# Patient Record
Sex: Female | Born: 1945 | Race: Black or African American | Hispanic: No | Smoking: Former smoker
Health system: Southern US, Community
[De-identification: ages and names within clinical notes are randomized; demographics above are authoritative.]

## PROBLEM LIST (undated history)

## (undated) DIAGNOSIS — I7090 Unspecified atherosclerosis: Secondary | ICD-10-CM

## (undated) DIAGNOSIS — M8580 Other specified disorders of bone density and structure, unspecified site: Secondary | ICD-10-CM

## (undated) DIAGNOSIS — E7800 Pure hypercholesterolemia, unspecified: Secondary | ICD-10-CM

## (undated) DIAGNOSIS — Z86010 Personal history of colonic polyps: Secondary | ICD-10-CM

## (undated) DIAGNOSIS — I10 Essential (primary) hypertension: Secondary | ICD-10-CM

## (undated) DIAGNOSIS — R7303 Prediabetes: Secondary | ICD-10-CM

## (undated) DIAGNOSIS — J45909 Unspecified asthma, uncomplicated: Secondary | ICD-10-CM

## (undated) DIAGNOSIS — M1990 Unspecified osteoarthritis, unspecified site: Secondary | ICD-10-CM

## (undated) HISTORY — DX: Unspecified atherosclerosis: I70.90

## (undated) HISTORY — DX: Unspecified asthma, uncomplicated: J45.909

## (undated) HISTORY — DX: Other specified disorders of bone density and structure, unspecified site: M85.80

## (undated) HISTORY — DX: Personal history of colonic polyps: Z86.010

## (undated) HISTORY — DX: Unspecified osteoarthritis, unspecified site: M19.90

## (undated) HISTORY — DX: Prediabetes: R73.03

---

## 1988-08-03 HISTORY — PX: PARTIAL HYSTERECTOMY: SHX80

## 1998-02-20 ENCOUNTER — Emergency Department (HOSPITAL_COMMUNITY): Admission: EM | Admit: 1998-02-20 | Discharge: 1998-02-20 | Payer: Self-pay | Admitting: Internal Medicine

## 1998-12-09 ENCOUNTER — Other Ambulatory Visit: Admission: RE | Admit: 1998-12-09 | Discharge: 1998-12-09 | Payer: Self-pay | Admitting: Obstetrics and Gynecology

## 2000-02-16 ENCOUNTER — Ambulatory Visit (HOSPITAL_COMMUNITY): Admission: RE | Admit: 2000-02-16 | Discharge: 2000-02-16 | Payer: Self-pay | Admitting: Family Medicine

## 2000-02-16 ENCOUNTER — Encounter: Payer: Self-pay | Admitting: Family Medicine

## 2001-09-01 ENCOUNTER — Encounter: Payer: Self-pay | Admitting: Family Medicine

## 2001-09-01 ENCOUNTER — Ambulatory Visit (HOSPITAL_COMMUNITY): Admission: RE | Admit: 2001-09-01 | Discharge: 2001-09-01 | Payer: Self-pay | Admitting: Obstetrics and Gynecology

## 2002-02-07 ENCOUNTER — Ambulatory Visit (HOSPITAL_BASED_OUTPATIENT_CLINIC_OR_DEPARTMENT_OTHER): Admission: RE | Admit: 2002-02-07 | Discharge: 2002-02-07 | Payer: Self-pay | Admitting: Orthopedic Surgery

## 2002-08-03 HISTORY — PX: BREAST EXCISIONAL BIOPSY: SUR124

## 2003-02-16 ENCOUNTER — Encounter: Admission: RE | Admit: 2003-02-16 | Discharge: 2003-02-16 | Payer: Self-pay | Admitting: Family Medicine

## 2003-02-16 ENCOUNTER — Encounter: Payer: Self-pay | Admitting: Family Medicine

## 2003-04-10 ENCOUNTER — Encounter: Admission: RE | Admit: 2003-04-10 | Discharge: 2003-04-10 | Payer: Self-pay | Admitting: General Surgery

## 2003-04-10 ENCOUNTER — Encounter (INDEPENDENT_AMBULATORY_CARE_PROVIDER_SITE_OTHER): Payer: Self-pay | Admitting: Specialist

## 2003-04-10 ENCOUNTER — Encounter: Payer: Self-pay | Admitting: General Surgery

## 2003-04-10 ENCOUNTER — Ambulatory Visit (HOSPITAL_BASED_OUTPATIENT_CLINIC_OR_DEPARTMENT_OTHER): Admission: RE | Admit: 2003-04-10 | Discharge: 2003-04-10 | Payer: Self-pay | Admitting: General Surgery

## 2004-06-23 ENCOUNTER — Ambulatory Visit (HOSPITAL_COMMUNITY): Admission: RE | Admit: 2004-06-23 | Discharge: 2004-06-23 | Payer: Self-pay | Admitting: Family Medicine

## 2007-12-17 ENCOUNTER — Emergency Department (HOSPITAL_COMMUNITY): Admission: EM | Admit: 2007-12-17 | Discharge: 2007-12-17 | Payer: Self-pay | Admitting: Emergency Medicine

## 2008-01-03 ENCOUNTER — Encounter (INDEPENDENT_AMBULATORY_CARE_PROVIDER_SITE_OTHER): Payer: Self-pay | Admitting: Family Medicine

## 2008-01-03 ENCOUNTER — Ambulatory Visit: Payer: Self-pay | Admitting: Internal Medicine

## 2008-01-03 LAB — CONVERTED CEMR LAB
Albumin: 4.5 g/dL (ref 3.5–5.2)
BUN: 14 mg/dL (ref 6–23)
CO2: 26 meq/L (ref 19–32)
Calcium: 9.5 mg/dL (ref 8.4–10.5)
Cholesterol: 177 mg/dL (ref 0–200)
Eosinophils Relative: 5 % (ref 0–5)
Glucose, Bld: 96 mg/dL (ref 70–99)
HCT: 41.7 % (ref 36.0–46.0)
Lymphocytes Relative: 55 % — ABNORMAL HIGH (ref 12–46)
Lymphs Abs: 2.3 10*3/uL (ref 0.7–4.0)
Monocytes Relative: 8 % (ref 3–12)
Neutrophils Relative %: 31 % — ABNORMAL LOW (ref 43–77)
Platelets: 281 10*3/uL (ref 150–400)
Potassium: 4.3 meq/L (ref 3.5–5.3)
RBC: 4.35 M/uL (ref 3.87–5.11)
Sodium: 142 meq/L (ref 135–145)
Total Protein: 7.2 g/dL (ref 6.0–8.3)
Triglycerides: 188 mg/dL — ABNORMAL HIGH (ref <150)
WBC: 4.1 10*3/uL (ref 4.0–10.5)

## 2008-01-23 ENCOUNTER — Ambulatory Visit: Payer: Self-pay | Admitting: Internal Medicine

## 2008-01-25 ENCOUNTER — Ambulatory Visit: Payer: Self-pay | Admitting: *Deleted

## 2008-02-22 ENCOUNTER — Ambulatory Visit: Payer: Self-pay | Admitting: Family Medicine

## 2008-03-07 ENCOUNTER — Ambulatory Visit: Payer: Self-pay | Admitting: Internal Medicine

## 2008-03-07 LAB — CONVERTED CEMR LAB
BUN: 14 mg/dL (ref 6–23)
CO2: 27 meq/L (ref 19–32)
Chloride: 102 meq/L (ref 96–112)
Glucose, Bld: 103 mg/dL — ABNORMAL HIGH (ref 70–99)
Potassium: 3.8 meq/L (ref 3.5–5.3)

## 2008-03-21 ENCOUNTER — Ambulatory Visit: Payer: Self-pay | Admitting: Internal Medicine

## 2008-04-27 ENCOUNTER — Ambulatory Visit: Payer: Self-pay | Admitting: Family Medicine

## 2008-06-25 ENCOUNTER — Ambulatory Visit: Payer: Self-pay | Admitting: Family Medicine

## 2008-07-09 ENCOUNTER — Ambulatory Visit: Payer: Self-pay | Admitting: Internal Medicine

## 2008-07-23 ENCOUNTER — Ambulatory Visit: Payer: Self-pay | Admitting: Family Medicine

## 2008-08-09 ENCOUNTER — Ambulatory Visit: Payer: Self-pay | Admitting: Family Medicine

## 2008-08-10 ENCOUNTER — Encounter: Payer: Self-pay | Admitting: Family Medicine

## 2008-08-10 LAB — CONVERTED CEMR LAB
Albumin: 4.5 g/dL (ref 3.5–5.2)
Alkaline Phosphatase: 63 units/L (ref 39–117)
BUN: 11 mg/dL (ref 6–23)
Barbiturate Quant, Ur: NEGATIVE
Cocaine Metabolites: NEGATIVE
Creatinine, Ser: 0.95 mg/dL (ref 0.40–1.20)
Creatinine,U: 153.8 mg/dL
Glucose, Bld: 99 mg/dL (ref 70–99)
HDL: 71 mg/dL (ref >39)
LDL Cholesterol: 136 mg/dL — ABNORMAL HIGH (ref 0–99)
Opiate Screen, Urine: NEGATIVE
Potassium: 4.1 meq/L (ref 3.5–5.3)
Total Bilirubin: 0.5 mg/dL (ref 0.3–1.2)
Total CHOL/HDL Ratio: 3.3
Triglycerides: 151 mg/dL — ABNORMAL HIGH (ref <150)

## 2008-08-22 ENCOUNTER — Ambulatory Visit: Payer: Self-pay | Admitting: Internal Medicine

## 2008-08-29 ENCOUNTER — Ambulatory Visit (HOSPITAL_COMMUNITY): Admission: RE | Admit: 2008-08-29 | Discharge: 2008-08-29 | Payer: Self-pay | Admitting: Family Medicine

## 2008-11-07 ENCOUNTER — Encounter: Payer: Self-pay | Admitting: Family Medicine

## 2008-11-07 ENCOUNTER — Ambulatory Visit: Payer: Self-pay | Admitting: Internal Medicine

## 2008-11-07 LAB — CONVERTED CEMR LAB
Total CHOL/HDL Ratio: 3
VLDL: 26 mg/dL (ref 0–40)

## 2008-11-14 ENCOUNTER — Ambulatory Visit: Payer: Self-pay | Admitting: Internal Medicine

## 2009-01-16 ENCOUNTER — Ambulatory Visit: Payer: Self-pay | Admitting: Internal Medicine

## 2009-03-19 ENCOUNTER — Ambulatory Visit: Payer: Self-pay | Admitting: Internal Medicine

## 2010-12-19 NOTE — Op Note (Signed)
. [facility]  Patient:    Kelly Mccann, Kelly Mccann Visit Number: [id number] MRN: [id number]          Service Type: DSU Location: [facility] Attending Physician:  Ronne Binning Dictated by:   Nicki Reaper, M.D. Proc. Date: 02/07/02 Admit Date:  02/07/2002                             Operative Report  PREOPERATIVE DIAGNOSIS:  Stenosing synovitis, right thumb.  POSTOPERATIVE DIAGNOSIS:  Stenosing synovitis, right thumb.  OPERATION:  Release of A-1 pulley, right thumb.  SURGEON:  Nicki Reaper, M.D.  ASSISTANT:  Joaquin Courts, R.N.  ANESTHESIA:  Forearm-based IV regional.  HISTORY:  The patient is a 65 year old female with history of triggering of her right thumb not responsive to conservative treatment.  DESCRIPTION OF PROCEDURE:  The patient was brought to the operating room where a forearm-based IV regional anesthetic was carried out without difficulty. She was prepped and draped using Betadine scrubbing solution with the right arm free.  A transverse incision was made over the A-1 pulley and carried down through subcutaneous tissue.  Bleeders were electrocauterized, the neurovascular structures protected.  The A-1 pulley was identified.  To the radial side, an incision was made, releasing the A-1 pulley; the oblique pulley was left intact.  The thumb was placed through a full range of motion and no further triggering was identified.  The wound was irrigated.  The skin was closed with interrupted 5-0 nylon sutures.  A sterile compressive dressing was applied.  The patient tolerated the procedure well and was taken to the recovery room for observation in satisfactory condition.  She is discharged home to return to the [facility] in one week on Vicodin and Keflex. Dictated by:   Nicki Reaper, M.D. Attending Physician:  Ronne Binning DD:  02/07/02 TD:  02/09/02 Job: [id number]

## 2010-12-19 NOTE — Op Note (Signed)
   NAME:  Selvey, Dinita A                         ACCOUNT NO.:  [id number]   MEDICAL RECORD NO.:  [id number]                   PATIENT TYPE:  AMB   LOCATION:  DSC                                  FACILITY:  [facility]   PHYSICIAN:  Rose Phi. Maple Hudson, M.D.                DATE OF BIRTH:  1946-05-15   DATE OF PROCEDURE:  04/10/2003  DATE OF DISCHARGE:                                 OPERATIVE REPORT   PREOPERATIVE DIAGNOSIS:  Left breast mass.   POSTOPERATIVE DIAGNOSIS:  Left breast mass.   OPERATION:  Excision of left breast mass with needle localization and  specimen mammography.   SURGEON:  Rose Phi. Maple Hudson, M.D.   ANESTHESIA:  MAC.   OPERATIVE PROCEDURE:  The patient was placed on the operating table and the  left breast prepped and draped in usual fashion.  The mass was at about the  12 o'clock position and, using the wire as a guide, a curved incision was  outlined.  The area was then thoroughly infiltrated with a mixture of 1%  Xylocaine and 0.25% Marcaine.  The incision was then made and a wide  excision of the wire and surrounding tissue was carried out.  Specimen  mammography confirmed the removal of the nodule.  Hemostasis was obtained  with the cautery.  Subcuticular closure of 4-0 Monocryl and Steri-Strips was  carried out.  Dressing applied.  Patient transferred to recovery room in  satisfactory condition, having tolerated the procedure well.                                               Rose Phi. Maple Hudson, M.D.    PRY/MEDQ  D:  04/10/2003  T:  04/10/2003  Job:  [id number]

## 2011-04-29 LAB — BASIC METABOLIC PANEL
CO2: 28
Calcium: 9.4
GFR calc Af Amer: >60
GFR calc non Af Amer: >60
Sodium: 141

## 2013-01-17 ENCOUNTER — Other Ambulatory Visit (HOSPITAL_COMMUNITY): Payer: Self-pay | Admitting: Internal Medicine

## 2013-01-17 DIAGNOSIS — Z1231 Encounter for screening mammogram for malignant neoplasm of breast: Secondary | ICD-10-CM

## 2013-01-30 ENCOUNTER — Ambulatory Visit (HOSPITAL_COMMUNITY)
Admission: RE | Admit: 2013-01-30 | Discharge: 2013-01-30 | Disposition: A | Discharge status: Home / Self Care | Payer: Medicare Other | Source: Ambulatory Visit | Attending: Internal Medicine | Admitting: Internal Medicine

## 2013-01-30 DIAGNOSIS — Z1231 Encounter for screening mammogram for malignant neoplasm of breast: Secondary | ICD-10-CM | POA: Insufficient documentation

## 2014-02-19 ENCOUNTER — Other Ambulatory Visit (HOSPITAL_COMMUNITY): Payer: Self-pay | Admitting: Internal Medicine

## 2014-02-19 DIAGNOSIS — Z1231 Encounter for screening mammogram for malignant neoplasm of breast: Secondary | ICD-10-CM

## 2014-03-06 ENCOUNTER — Ambulatory Visit (HOSPITAL_COMMUNITY)
Admission: RE | Admit: 2014-03-06 | Discharge: 2014-03-06 | Disposition: A | Discharge status: Home / Self Care | Payer: Medicare Other | Source: Ambulatory Visit | Attending: Internal Medicine | Admitting: Internal Medicine

## 2014-03-06 DIAGNOSIS — Z1231 Encounter for screening mammogram for malignant neoplasm of breast: Secondary | ICD-10-CM | POA: Diagnosis present

## 2015-02-22 ENCOUNTER — Other Ambulatory Visit (HOSPITAL_COMMUNITY): Payer: Self-pay | Admitting: Internal Medicine

## 2015-02-22 DIAGNOSIS — Z1231 Encounter for screening mammogram for malignant neoplasm of breast: Secondary | ICD-10-CM

## 2015-03-08 ENCOUNTER — Ambulatory Visit (HOSPITAL_COMMUNITY)
Admission: RE | Admit: 2015-03-08 | Discharge: 2015-03-08 | Disposition: A | Discharge status: Home / Self Care | Payer: Medicare Other | Source: Ambulatory Visit | Attending: Internal Medicine | Admitting: Internal Medicine

## 2015-03-08 DIAGNOSIS — Z1231 Encounter for screening mammogram for malignant neoplasm of breast: Secondary | ICD-10-CM

## 2015-08-18 ENCOUNTER — Emergency Department (HOSPITAL_COMMUNITY): Payer: Medicare Other

## 2015-08-18 ENCOUNTER — Encounter (HOSPITAL_COMMUNITY): Payer: Self-pay | Admitting: Emergency Medicine

## 2015-08-18 ENCOUNTER — Emergency Department (HOSPITAL_COMMUNITY)
Admission: EM | Admit: 2015-08-18 | Discharge: 2015-08-18 | Disposition: A | Discharge status: Home / Self Care | Payer: Medicare Other | Attending: Emergency Medicine | Admitting: Emergency Medicine

## 2015-08-18 DIAGNOSIS — Z8639 Personal history of other endocrine, nutritional and metabolic disease: Secondary | ICD-10-CM | POA: Insufficient documentation

## 2015-08-18 DIAGNOSIS — I10 Essential (primary) hypertension: Secondary | ICD-10-CM | POA: Insufficient documentation

## 2015-08-18 DIAGNOSIS — R000 Tachycardia, unspecified: Secondary | ICD-10-CM | POA: Insufficient documentation

## 2015-08-18 DIAGNOSIS — Z87891 Personal history of nicotine dependence: Secondary | ICD-10-CM | POA: Insufficient documentation

## 2015-08-18 DIAGNOSIS — J209 Acute bronchitis, unspecified: Secondary | ICD-10-CM | POA: Insufficient documentation

## 2015-08-18 DIAGNOSIS — R0602 Shortness of breath: Secondary | ICD-10-CM | POA: Diagnosis present

## 2015-08-18 DIAGNOSIS — J40 Bronchitis, not specified as acute or chronic: Secondary | ICD-10-CM

## 2015-08-18 HISTORY — DX: Pure hypercholesterolemia, unspecified: E78.00

## 2015-08-18 HISTORY — DX: Essential (primary) hypertension: I10

## 2015-08-18 MED ORDER — IPRATROPIUM BROMIDE 0.02 % IN SOLN
0.5000 mg | Freq: Once | RESPIRATORY_TRACT | Status: AC
  Administered 2015-08-18: 0.5 mg via RESPIRATORY_TRACT
  Filled 2015-08-18: qty 2.5

## 2015-08-18 MED ORDER — AZITHROMYCIN 250 MG PO TABS: 250.0000 mg | ORAL_TABLET | Freq: Every day | ORAL | Status: DC

## 2015-08-18 MED ORDER — ALBUTEROL SULFATE (2.5 MG/3ML) 0.083% IN NEBU
5.0000 mg | INHALATION_SOLUTION | Freq: Once | RESPIRATORY_TRACT | Status: AC
  Administered 2015-08-18: 5 mg via RESPIRATORY_TRACT
  Filled 2015-08-18: qty 6

## 2015-08-18 MED ORDER — PREDNISONE 20 MG PO TABS: 40.0000 mg | ORAL_TABLET | Freq: Every day | ORAL | Status: DC

## 2015-08-18 MED ORDER — PREDNISONE 20 MG PO TABS
60.0000 mg | ORAL_TABLET | Freq: Once | ORAL | Status: AC
  Administered 2015-08-18: 60 mg via ORAL
  Filled 2015-08-18: qty 3

## 2015-08-18 NOTE — ED Notes (Signed)
Patient transported to X-ray 

## 2015-08-18 NOTE — ED Notes (Signed)
Family at bedside. 

## 2015-08-18 NOTE — ED Provider Notes (Addendum)
CSN: [id number]     Arrival date & time 08/18/15  0945 History   First MD Initiated Contact with Patient 08/18/15 1021     Chief Complaint  Patient presents with  . Shortness of Breath     (Consider location/radiation/quality/duration/timing/severity/associated sxs/prior Treatment) Patient is a 70 y.o. female presenting with shortness of breath. The history is provided by the patient.  Shortness of Breath Severity:  Moderate Onset quality:  Gradual Duration:  2 weeks Timing:  Constant Progression:  Worsening Chronicity:  New Context: URI   Relieved by:  Nothing Worsened by:  Exertion Ineffective treatments:  Inhaler Associated symptoms: cough, fever, sputum production and wheezing   Associated symptoms: no abdominal pain, no syncope and no vomiting   Associated symptoms comment:  States her chest is sore from all the coughing Risk factors: no prolonged immobilization and no tobacco use   Risk factors comment:  Patient states she hasn't smoked for tender 15 years denies drug or alcohol use. States in the past she has had use an inhaler for wheezing   Past Medical History  Diagnosis Date  . Hypertension   . Hypercholesterolemia    No past surgical history on file. No family history on file. Social History  Substance Use Topics  . Smoking status: Former Smoker    Types: Cigarettes    Quit date: 08/18/1999  . Smokeless tobacco: Not on file  . Alcohol Use: No   OB History    No data available     Review of Systems  Constitutional: Positive for fever.  Respiratory: Positive for cough, sputum production, shortness of breath and wheezing.   Cardiovascular: Negative for syncope.  Gastrointestinal: Negative for vomiting and abdominal pain.  All other systems reviewed and are negative.     Allergies  Review of patient's allergies indicates no known allergies.  Home Medications   Prior to Admission medications   Not on File   BP 147/97 mmHg  Pulse 113   Temp(Src) 98 F (36.7 C) (Oral)  Resp 19  SpO2 96% Physical Exam  Constitutional: She is oriented to person, place, and time. She appears well-developed and well-nourished. No distress.  HENT:  Head: Normocephalic and atraumatic.  Right Ear: Tympanic membrane normal.  Left Ear: Tympanic membrane normal.  Nose: Mucosal edema and rhinorrhea present.  Mouth/Throat: Oropharynx is clear and moist.  Eyes: Conjunctivae and EOM are normal. Pupils are equal, round, and reactive to light.  Neck: Normal range of motion. Neck supple.  Cardiovascular: Regular rhythm and intact distal pulses.  Tachycardia present.   No murmur heard. Pulmonary/Chest: Effort normal. No respiratory distress. She has wheezes. She has no rales.  Abdominal: Soft. She exhibits no distension. There is no tenderness. There is no rebound and no guarding.  Musculoskeletal: Normal range of motion. She exhibits no edema or tenderness.  Neurological: She is alert and oriented to person, place, and time.  Skin: Skin is warm and dry. No rash noted. No erythema.  Psychiatric: She has a normal mood and affect. Her behavior is normal.  Nursing note and vitals reviewed.   ED Course  Procedures (including critical care time) Labs Review Labs Reviewed - No data to display  Imaging Review Dg Chest 2 View  08/18/2015  CLINICAL DATA:  Wheezing and shortness of breath 2 weeks worsening over the past 3 days. Cough. EXAM: CHEST  2 VIEW COMPARISON:  06/23/2004 FINDINGS: Lungs are adequately inflated without consolidation or effusion. Cardiomediastinal silhouette is within normal. Mild emphysematous disease over  the upper lungs. Cardiomediastinal silhouette is within normal. There is degenerative change of the spine. IMPRESSION: No active cardiopulmonary disease. Emphysematous disease. Electronically Signed   By: Marin Olp M.D.   On: 08/18/2015 11:38   I have personally reviewed and evaluated these images and lab results as part of my  medical decision-making.   EKG Interpretation   Date/Time:  Sunday August 18 2015 09:53:07 EST Ventricular Rate:  121 PR Interval:  107 QRS Duration: 97 QT Interval:  334 QTC Calculation: 474 R Axis:   49 Text Interpretation:  Sinus tachycardia Borderline low voltage, extremity  leads Nonspecific repol abnormality, diffuse leads No significant change  since last tracing Confirmed by Maryan Rued  MD, Loree Fee ([id number]) on 08/18/2015  10:22:46 AM      MDM   Final diagnoses:  Bronchitis    Patient is a 70 year old female with a history of hypertension, hyperlipidemia and was a former smoker approximately 10 or 15 years ago who stopped taking all of her medications including hydrochlorothiazide and atenolol 2 weeks ago but comes in today with cold-like symptoms, wheezing and shortness of breath. Patient states that she's had to use an albuterol inhaler in the past but does not have a diagnosis of COPD or asthma. She is wheezing diffusely on exam but able to speak in complete sentences and oxygen saturation is 96%. Patient is tachycardic between 110 and 120 however she was beta blocked and is currently not taking a beta blocker.  EKG shows sinus tachycardia without evidence of A. fib or other dysrhythmia.  Patient is otherwise well-appearing. Feel most likely patient's symptoms are related to URI with bronchitis versus pneumonia. Patient has no signs of fluid overload concerning for CHF. She was given albuterol, Atrovent and prednisone. Chest x-ray pending  12:08 PM Chest x-ray without acute findings. Patient's wheezing improved with first neb will give a second  2:56 PM Pt much improved after her 3rd treatment however remained tachycardic which is most likely related to multiple albuterol treatments and recent self d/c of atenolol and pt was d/ced home with azithro and prednisone.  Blanchie Dessert, MD 08/18/15 1457  Blanchie Dessert, MD 08/19/15 [phone]

## 2015-08-18 NOTE — ED Notes (Signed)
Pt states that she has been feeling increasingly SOB x 2 wks.  States that she took herself off of her blood pressure medicine last Monday and replaced it with flax seed and garlic.  Denies CP.  Expiratory wheezing bilaterally.

## 2016-03-14 ENCOUNTER — Encounter (HOSPITAL_COMMUNITY): Payer: Self-pay | Admitting: Emergency Medicine

## 2016-03-14 ENCOUNTER — Emergency Department (HOSPITAL_COMMUNITY): Payer: Medicare Other

## 2016-03-14 ENCOUNTER — Inpatient Hospital Stay (HOSPITAL_COMMUNITY)
Admission: EM | Admit: 2016-03-14 | Discharge: 2016-03-17 | DRG: 390 | Disposition: A | Discharge status: Home / Self Care | Payer: Medicare Other | Attending: Internal Medicine | Admitting: Internal Medicine

## 2016-03-14 DIAGNOSIS — Z90710 Acquired absence of both cervix and uterus: Secondary | ICD-10-CM | POA: Diagnosis not present

## 2016-03-14 DIAGNOSIS — K5669 Other intestinal obstruction: Secondary | ICD-10-CM

## 2016-03-14 DIAGNOSIS — E876 Hypokalemia: Secondary | ICD-10-CM | POA: Diagnosis present

## 2016-03-14 DIAGNOSIS — K565 Intestinal adhesions [bands] with obstruction (postprocedural) (postinfection): Secondary | ICD-10-CM | POA: Diagnosis present

## 2016-03-14 DIAGNOSIS — Z87891 Personal history of nicotine dependence: Secondary | ICD-10-CM | POA: Diagnosis not present

## 2016-03-14 DIAGNOSIS — I2510 Atherosclerotic heart disease of native coronary artery without angina pectoris: Secondary | ICD-10-CM | POA: Diagnosis present

## 2016-03-14 DIAGNOSIS — K56609 Unspecified intestinal obstruction, unspecified as to partial versus complete obstruction: Secondary | ICD-10-CM | POA: Diagnosis present

## 2016-03-14 DIAGNOSIS — E7800 Pure hypercholesterolemia, unspecified: Secondary | ICD-10-CM | POA: Diagnosis present

## 2016-03-14 DIAGNOSIS — Z79899 Other long term (current) drug therapy: Secondary | ICD-10-CM

## 2016-03-14 DIAGNOSIS — R109 Unspecified abdominal pain: Secondary | ICD-10-CM | POA: Diagnosis present

## 2016-03-14 DIAGNOSIS — K5909 Other constipation: Secondary | ICD-10-CM | POA: Diagnosis present

## 2016-03-14 DIAGNOSIS — I10 Essential (primary) hypertension: Secondary | ICD-10-CM | POA: Diagnosis present

## 2016-03-14 LAB — CBC
HCT: 46.9 % — ABNORMAL HIGH (ref 36.0–46.0)
HEMOGLOBIN: 15.3 g/dL — AB (ref 12.0–15.0)
MCH: 31.3 pg (ref 26.0–34.0)
MCHC: 32.6 g/dL (ref 30.0–36.0)
MCV: 95.9 fL (ref 78.0–100.0)
Platelets: 309 10*3/uL (ref 150–400)
RBC: 4.89 MIL/uL (ref 3.87–5.11)
RDW: 14.4 % (ref 11.5–15.5)
WBC: 8.8 10*3/uL (ref 4.0–10.5)

## 2016-03-14 LAB — COMPREHENSIVE METABOLIC PANEL
ALK PHOS: 64 U/L (ref 38–126)
ALT: 23 U/L (ref 14–54)
AST: 40 U/L (ref 15–41)
Albumin: 3.9 g/dL (ref 3.5–5.0)
Anion gap: 9 (ref 5–15)
BILIRUBIN TOTAL: 0.9 mg/dL (ref 0.3–1.2)
BUN: 21 mg/dL — AB (ref 6–20)
CHLORIDE: 108 mmol/L (ref 101–111)
CO2: 22 mmol/L (ref 22–32)
CREATININE: 1.06 mg/dL — AB (ref 0.44–1.00)
Calcium: 8.5 mg/dL — ABNORMAL LOW (ref 8.9–10.3)
GFR calc Af Amer: >60 mL/min (ref >60)
GFR, EST NON AFRICAN AMERICAN: 53 mL/min — AB (ref >60)
GLUCOSE: 154 mg/dL — AB (ref 65–99)
POTASSIUM: 5.1 mmol/L (ref 3.5–5.1)
Sodium: 139 mmol/L (ref 135–145)
Total Protein: 7.1 g/dL (ref 6.5–8.1)

## 2016-03-14 LAB — CBC WITH DIFFERENTIAL/PLATELET
Basophils Absolute: 0 10*3/uL (ref 0.0–0.1)
Basophils Relative: 0 %
Eosinophils Absolute: 0 10*3/uL (ref 0.0–0.7)
Eosinophils Relative: 0 %
HEMATOCRIT: 51.3 % — AB (ref 36.0–46.0)
HEMOGLOBIN: 16.8 g/dL — AB (ref 12.0–15.0)
LYMPHS ABS: 1.7 10*3/uL (ref 0.7–4.0)
Lymphocytes Relative: 19 %
MCH: 30.9 pg (ref 26.0–34.0)
MCHC: 32.7 g/dL (ref 30.0–36.0)
MCV: 94.5 fL (ref 78.0–100.0)
MONOS PCT: 4 %
Monocytes Absolute: 0.3 10*3/uL (ref 0.1–1.0)
NEUTROS ABS: 6.6 10*3/uL (ref 1.7–7.7)
NEUTROS PCT: 77 %
Platelets: 285 10*3/uL (ref 150–400)
RBC: 5.43 MIL/uL — ABNORMAL HIGH (ref 3.87–5.11)
RDW: 14.3 % (ref 11.5–15.5)
WBC: 8.6 10*3/uL (ref 4.0–10.5)

## 2016-03-14 LAB — CREATININE, SERUM
CREATININE: 1.2 mg/dL — AB (ref 0.44–1.00)
GFR calc Af Amer: 53 mL/min — ABNORMAL LOW (ref >60)
GFR calc non Af Amer: 45 mL/min — ABNORMAL LOW (ref >60)

## 2016-03-14 LAB — LIPASE, BLOOD: Lipase: 19 U/L (ref 11–51)

## 2016-03-14 LAB — PROTIME-INR
INR: 1
PROTHROMBIN TIME: 13.2 s (ref 11.4–15.2)

## 2016-03-14 LAB — TROPONIN I: Troponin I: <0.03 ng/mL (ref <0.03)

## 2016-03-14 MED ORDER — HYDROMORPHONE HCL 1 MG/ML IJ SOLN
1.0000 mg | INTRAMUSCULAR | Status: DC | PRN
  Administered 2016-03-14 – 2016-03-16 (×3): 1 mg via INTRAVENOUS
  Filled 2016-03-14 (×5): qty 1

## 2016-03-14 MED ORDER — SODIUM CHLORIDE 0.9 % IV BOLUS (SEPSIS)
1000.0000 mL | Freq: Once | INTRAVENOUS | Status: AC
  Administered 2016-03-14: 1000 mL via INTRAVENOUS

## 2016-03-14 MED ORDER — HEPARIN SODIUM (PORCINE) 5000 UNIT/ML IJ SOLN
5000.0000 [IU] | Freq: Three times a day (TID) | INTRAMUSCULAR | Status: DC
  Administered 2016-03-14 – 2016-03-16 (×8): 5000 [IU] via SUBCUTANEOUS
  Filled 2016-03-14 (×8): qty 1

## 2016-03-14 MED ORDER — FENTANYL CITRATE (PF) 100 MCG/2ML IJ SOLN
50.0000 ug | Freq: Once | INTRAMUSCULAR | Status: AC | PRN
  Administered 2016-03-14: 50 ug via INTRAVENOUS
  Filled 2016-03-14: qty 2

## 2016-03-14 MED ORDER — IOPAMIDOL (ISOVUE-300) INJECTION 61%
100.0000 mL | Freq: Once | INTRAVENOUS | Status: AC | PRN
  Administered 2016-03-14: 100 mL via INTRAVENOUS

## 2016-03-14 MED ORDER — ONDANSETRON HCL 4 MG/2ML IJ SOLN
4.0000 mg | Freq: Once | INTRAMUSCULAR | Status: AC
  Administered 2016-03-14: 4 mg via INTRAVENOUS
  Filled 2016-03-14: qty 2

## 2016-03-14 MED ORDER — SODIUM CHLORIDE 0.9 % IV SOLN
3.0000 g | Freq: Four times a day (QID) | INTRAVENOUS | Status: DC
  Administered 2016-03-14 – 2016-03-17 (×12): 3 g via INTRAVENOUS
  Filled 2016-03-14 (×13): qty 3

## 2016-03-14 MED ORDER — ONDANSETRON HCL 4 MG/2ML IJ SOLN: 4.0000 mg | Freq: Four times a day (QID) | INTRAMUSCULAR | Status: DC | PRN

## 2016-03-14 MED ORDER — MORPHINE SULFATE (PF) 2 MG/ML IV SOLN: 2.0000 mg | INTRAVENOUS | Status: DC | PRN

## 2016-03-14 MED ORDER — ALBUTEROL SULFATE (2.5 MG/3ML) 0.083% IN NEBU: 2.5000 mg | INHALATION_SOLUTION | RESPIRATORY_TRACT | Status: DC | PRN

## 2016-03-14 MED ORDER — DICYCLOMINE HCL 10 MG/ML IM SOLN
20.0000 mg | Freq: Once | INTRAMUSCULAR | Status: AC
  Administered 2016-03-14: 20 mg via INTRAMUSCULAR
  Filled 2016-03-14: qty 2

## 2016-03-14 MED ORDER — ONDANSETRON HCL 4 MG PO TABS: 4.0000 mg | ORAL_TABLET | Freq: Four times a day (QID) | ORAL | Status: DC | PRN

## 2016-03-14 MED ORDER — METOPROLOL TARTRATE 5 MG/5ML IV SOLN: 5.0000 mg | INTRAVENOUS | Status: DC | PRN

## 2016-03-14 MED ORDER — HYDRALAZINE HCL 20 MG/ML IJ SOLN
10.0000 mg | Freq: Four times a day (QID) | INTRAMUSCULAR | Status: DC | PRN
  Filled 2016-03-14: qty 1

## 2016-03-14 NOTE — Consult Note (Addendum)
Re:   GWENDELYN VILES DOB:   September 10, 1945 MRN:   [id number]   [facility] consultation  ASSESSMENT AND PLAN: 1.  SBO  Probably due to adhesions from prior hysterectomy  Plan:  NPO and IVF.  She is not vomiting and NGT is probably a plus/minus right now.  Repeat KUB, labs, and exam in AM  2.  HTN 3.  Hypercholesterolemia 4.  Knee trouble - just had injections at [facility]  Patient presents with  . Abdominal Pain   REFERRING PHYSICIAN: [facility]  HISTORY OF PRESENT ILLNESS: DESTRI ROSENWINKEL is a 70 y.o. (DOB: 05/29/1946)  AA female whose primary care physician is [facility] and comes to [facility] today for abdominal pain and nausea. She is by herself.  The patient did not feel quite right this week. She's had some vague nausea. Yesterday, however, she had increased abdominal pain and nausea. She has not vomited. Her last bowel movement was last night. She's had no prior history of bowel obstructions.  She had a hysterectomy for benign disease around 78. She has no history of peptic ulcer disease or liver disease gallbladder disease or colon disease. She's had no prior colonoscopy.  CT scan of abdomen - 03/14/2016 - 1. CT findings suggest a mid to distal small bowel obstruction proximal to a long thick-walled segment of ileum in the right abdomen. This may represent a functional small-bowel obstruction due to infectious or inflammatory ileitis or ischemia. No pneumatosis or pneumoperitoneum.  2. Trace ascites.  3. Aortic atherosclerosis.  Coronary atherosclerosis.    Past Medical History:  Diagnosis Date  . Hypercholesterolemia   . Hypertension       Past Surgical History:  Procedure Laterality Date  . ABDOMINAL HYSTERECTOMY        Current Facility-Administered Medications  Medication Dose Route Frequency Provider Last Rate Last Dose  . Ampicillin-Sulbactam (UNASYN) 3 g in sodium chloride 0.9 % 100 mL IVPB  3 g Intravenous Q6H  Anh P Pham, RPH 100 mL/hr at 03/14/16 1113 3 g at 03/14/16 1113  . hydrALAZINE (APRESOLINE) injection 10 mg  10 mg Intravenous Q6H PRN Thurnell Lose, MD      . HYDROmorphone (DILAUDID) injection 1 mg  1 mg Intravenous Q2H PRN Margarita Mail, PA-C   1 mg at 03/14/16 1013  . metoprolol (LOPRESSOR) injection 5 mg  5 mg Intravenous Q4H PRN Thurnell Lose, MD      . morphine 2 MG/ML injection 2 mg  2 mg Intravenous Q4H PRN Thurnell Lose, MD       Current Outpatient Prescriptions  Medication Sig Dispense Refill  . Coenzyme Q10 (CO Q 10) 100 MG CAPS Take 100 mg by mouth 2 (two) times daily.    . Flaxseed, Linseed, (FLAXSEED OIL) 1000 MG CAPS Take 1,000 mg by mouth 2 (two) times daily.    . Garlic [id number] MG CAPS Take 1,000 mg by mouth 2 (two) times daily.     Marland Kitchen lovastatin (MEVACOR) 20 MG tablet Take 20 mg by mouth daily.    . Multiple Vitamins-Minerals (MULTIVITAMIN ADULTS 50+ PO) Take 1 tablet by mouth daily.    . TURMERIC PO Take 1.25 mLs by mouth daily.       No Known Allergies  REVIEW OF SYSTEMS: Skin:  No history of rash.  No history of abnormal moles. Infection:  No history of hepatitis or HIV.  No history of MRSA. Neurologic:  No history  of stroke.  No history of seizure.  No history of headaches. Cardiac:  HTN.  No history of seeing a cardiologist. Pulmonary:  Does not smoke cigarettes.  No asthma or bronchitis.  No OSA/CPAP.  Endocrine:  No diabetes. Hypercholesterolemia Gastrointestinal:  No history of stomach disease.  No history of liver disease.  No history of gall bladder disease.  No history of pancreas disease.  No history of colon disease. Urologic:  No history of kidney stones.  No history of bladder infections. Musculoskeletal:  Has knee trouble.  Seen at [place] [facility], but cannot remember the physician's name.  Just had injections at [facility] Hematologic:  No bleeding disorder.  No history of anemia.  Not anticoagulated. Psycho-social:  The patient is oriented.    The patient has no obvious psychologic or social impairment to understanding our conversation and plan.  SOCIAL and FAMILY HISTORY: Divorced. Has 3 children:  53 yo daughter, 26 yo daughter, and 44 yo son.  Her son lives with her. She is retired.  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  PHYSICAL EXAM: BP 146/100 (BP Location: Left Arm)   Pulse 92   Temp 99.1 F (37.3 C) (Oral)   Resp 19   Ht 5\' 6"  (1.676 m)   Wt 88.5 kg (195 lb)   SpO2 98%   BMI 31.47 kg/m   General: WN AA F who is alert and generally healthy appearing.  HEENT: Normal. Pupils equal. Neck: Supple. No mass.  No thyroid mass. Lymph Nodes:  No supraclavicular or cervical nodes. Lungs: Clear to auscultation and symmetric breath sounds. Heart:  RRR. No murmur or rub.   Abdomen:  Mild distention.  Has BS.  No localized tenderness. Rectal: Not done. Extremities:  Good strength and ROM  in upper and lower extremities. Neurologic:  Grossly intact to motor and sensory function. Psychiatric: Has normal mood and affect. Behavior is normal.   DATA REVIEWED: Epic notes  Alphonsa Overall, MD,  [facility] [address] [place]  [place], [place]    [place] Phone:  [place]:  [phone]

## 2016-03-14 NOTE — Progress Notes (Signed)
Pharmacy Antibiotic Note  Kelly Mccann is a 70 y.o. female presented to the ED on 8/12 with c/o abd pain.  To start unasyn for suspected intra-abdominal infection.  - 8/12 CXR: no active dz - 8/12 abd CT: suspect a functional small-bowel obstruction due to infectious or inflammatory ileitis or ischemia. - afeb, wbc wnl, scr 1.06 (crcl~57)   Plan: - unasyn 3 gm IV q6h  __________________  Height: 5\' 6"  (167.6 cm) Weight: 195 lb (88.5 kg) IBW/kg (Calculated) : 59.3  Temp (24hrs), Avg:97.9 F (36.6 C), Min:97.7 F (36.5 C), Max:98.1 F (36.7 C)   Recent Labs Lab 03/14/16 0639 03/14/16 0716  WBC 8.6  --   CREATININE  --  1.06*    Estimated Creatinine Clearance: 56.9 mL/min (by C-G formula based on SCr of 1.06 mg/dL).    No Known Allergies   Thank you for allowing pharmacy to be a part of this patient's care.  Lynelle Doctor 03/14/2016 10:22 AM

## 2016-03-14 NOTE — Progress Notes (Signed)
Pt up to floor form ER on stretcher pushed by Tim, rn. Arrived in good/stable condition with no distress of any kind.  Assessment completed! Pt received information on advance directives.  Copy of information booklet given to pt.  VWilliams,rn.

## 2016-03-14 NOTE — ED Provider Notes (Signed)
[facility] DEPT Provider Note   CSN: [id number] Arrival date & time: 03/14/16  0600  First Provider Contact:  First MD Initiated Contact with Patient 03/14/16 0615        History   Chief Complaint Chief Complaint  Patient presents with  . Abdominal Pain    HPI Kelly Mccann is a 70 y.o. female who presents emergency Department with chief complaint of abdominal pain. The patient states that she has been constipated, making only small hard stools for the past several weeks. She began having some abdominal pain last night and took a laxative and Pepto-Bismol which allowed her to go to sleep for a few hours over she woke up with increasing epigastric abdominal pain which is crampy and colicky. She denies associated shortness of breath or chest pain but does feel a small amount of left lower chest wall pressure. The pain in her epigastrium, radiates throughout her abdomen. She denies a history of previous abdominal surgeries. She complains of associated nausea without vomiting. She has a past medical history of hypertension and hypercholesterolemia. She takes a statin for her cholesterol. She states that she has not been taking her hypertension medication for the past month because she was trying "herbs" such as [id number], garlic, and other supplements.  HPI  Past Medical History:  Diagnosis Date  . Hypercholesterolemia   . Hypertension     Patient Active Problem List   Diagnosis Date Noted  . SBO (small bowel obstruction) ([place]) 03/14/2016  . Hypercholesterolemia   . Hypertension     Past Surgical History:  Procedure Laterality Date  . ABDOMINAL HYSTERECTOMY      OB History    No data available       Home Medications    Prior to Admission medications   Medication Sig Start Date End Date Taking? Authorizing Provider  Coenzyme Q10 (CO Q 10) 100 MG CAPS Take 100 mg by mouth 2 (two) times daily.   Yes Historical Provider, MD  Flaxseed, Linseed, (FLAXSEED OIL) 1000 MG CAPS  Take 1,000 mg by mouth 2 (two) times daily.   Yes Historical Provider, MD  Garlic [id number] MG CAPS Take 1,000 mg by mouth 2 (two) times daily.    Yes Historical Provider, MD  lovastatin (MEVACOR) 20 MG tablet Take 20 mg by mouth daily.   Yes Historical Provider, MD  Multiple Vitamins-Minerals (MULTIVITAMIN ADULTS 50+ PO) Take 1 tablet by mouth daily.   Yes Historical Provider, MD  TURMERIC PO Take 1.25 mLs by mouth daily.   Yes Historical Provider, MD    Family History No family history on file.  Social History Social History  Substance Use Topics  . Smoking status: Former Smoker    Types: Cigarettes    Quit date: 08/18/1999  . Smokeless tobacco: Never Used  . Alcohol use No     Allergies   Review of patient's allergies indicates no known allergies.   Review of Systems Review of Systems Ten systems reviewed and are negative for acute change, except as noted in the HPI.    Physical Exam Updated Vital Signs BP (!) 159/94 (BP Location: Left Arm)   Pulse 90   Temp 97.7 F (36.5 C) (Oral)   Resp 18   Ht 5\' 6"  (1.676 m)   Wt 88.7 kg   SpO2 98%   BMI 31.55 kg/m   Physical Exam Physical Exam  Nursing note and vitals reviewed. Constitutional: She is oriented to person, place, and time. She appears well-developed and well-nourished.  No distress. Patient appears uncomfortable HENT:  Head: Normocephalic and atraumatic.  Eyes: Conjunctivae normal and EOM are normal. Pupils are equal, round, and reactive to light. No scleral icterus.  Neck: Normal range of motion.  Cardiovascular: Normal rate, regular rhythm and normal heart sounds.  Exam reveals no gallop and no friction rub.   No murmur heard. Pulmonary/Chest: Effort normal and breath sounds normal. No respiratory distress.  Abdominal: Soft. Mild distention, generalized tenderness, guarding. No rebound Neurological: She is alert and oriented to person, place, and time.  Skin: Skin is warm and dry. She is not diaphoretic.      ED Treatments / Results  Labs (all labs ordered are listed, but only abnormal results are displayed) Labs Reviewed  CBC WITH DIFFERENTIAL/PLATELET - Abnormal; Notable for the following:       Result Value   RBC 5.43 (*)    Hemoglobin 16.8 (*)    HCT 51.3 (*)    All other components within normal limits  COMPREHENSIVE METABOLIC PANEL - Abnormal; Notable for the following:    Glucose, Bld 154 (*)    BUN 21 (*)    Creatinine, Ser 1.06 (*)    Calcium 8.5 (*)    GFR calc non Af Amer 53 (*)    All other components within normal limits  CBC - Abnormal; Notable for the following:    Hemoglobin 15.3 (*)    HCT 46.9 (*)    All other components within normal limits  CREATININE, SERUM - Abnormal; Notable for the following:    Creatinine, Ser 1.20 (*)    GFR calc non Af Amer 45 (*)    GFR calc Af Amer 53 (*)    All other components within normal limits  LIPASE, BLOOD  TROPONIN I  PROTIME-INR  URINALYSIS, ROUTINE W REFLEX MICROSCOPIC (NOT AT [facility])    EKG  EKG Interpretation  Date/Time:  Saturday March 14 2016 06:15:54 EDT Ventricular Rate:  84 PR Interval:    QRS Duration: 93 QT Interval:  388 QTC Calculation: 459 R Axis:   13 Text Interpretation:  Sinus rhythm Short PR interval Probable left atrial enlargement Borderline repolarization abnormality Since last tracing rate slower Confirmed by Eulis Foster  MD, ELLIOTT [phone]) on 03/14/2016 6:28:57 AM       Radiology Ct Abdomen Pelvis W Contrast  Result Date: 03/14/2016 CLINICAL DATA:  Mid abdominal pain.  Nausea.  Hysterectomy. EXAM: CT ABDOMEN AND PELVIS WITH CONTRAST TECHNIQUE: Multidetector CT imaging of the abdomen and pelvis was performed using the standard protocol following bolus administration of intravenous contrast. CONTRAST:  111mL ISOVUE-300 IOPAMIDOL (ISOVUE-300) INJECTION 61% COMPARISON:  Abdominal radiographs from earlier today. FINDINGS: Lower chest: No significant pulmonary nodules or acute consolidative airspace  disease. Right coronary atherosclerosis. Hepatobiliary: Normal liver with no liver mass. Normal gallbladder with no radiopaque cholelithiasis. No biliary ductal dilatation. Pancreas: Normal, with no mass or duct dilation. Spleen: Normal size spleen. Calcified splenic granulomas. No splenic mass. Adrenals/Urinary Tract: Mild diffuse adrenal thickening bilaterally with no discrete adrenal nodules, suggesting mild adrenal hyperplasia. Normal kidneys with no hydronephrosis and no renal mass. Normal bladder. Stomach/Bowel: Grossly normal stomach. There is a long segment of thick-walled ileum in the right abdomen, proximal to which the small bowel is mildly to moderately dilated and fluid-filled, including small bowel stool sign in the right abdomen. No small bowel mass or pneumatosis. Normal appendix. Relatively collapsed and normal large bowel with no large bowel wall thickening or pericolonic fat stranding. Vascular/Lymphatic: Atherosclerotic nonaneurysmal abdominal aorta.  Patent portal, splenic, hepatic and renal veins. No pathologically enlarged lymph nodes in the abdomen or pelvis. Reproductive: Status post hysterectomy, with no abnormal findings at the vaginal cuff. No adnexal mass. Other: No pneumoperitoneum. Trace perihepatic ascites. Small volume ascites in the pelvic cul-de-sac. Musculoskeletal: No aggressive appearing focal osseous lesions. Mild thoracolumbar spondylosis. IMPRESSION: 1. CT findings suggest a mid to distal small bowel obstruction proximal to a long thick-walled segment of ileum in the right abdomen. This may represent a functional small-bowel obstruction due to infectious or inflammatory ileitis or ischemia. No pneumatosis or pneumoperitoneum. 2. Trace ascites. 3. Aortic atherosclerosis.  Coronary atherosclerosis. Electronically Signed   By: Ilona Sorrel M.D.   On: 03/14/2016 09:09   Dg Abd Acute W/chest  Result Date: 03/14/2016 CLINICAL DATA:  Abdominal pain.  Nausea.  Constipation. EXAM:  DG ABDOMEN ACUTE W/ 1V CHEST COMPARISON:  08/18/2015 chest radiograph. FINDINGS: Stable cardiomediastinal silhouette with normal heart size. No pneumothorax. No pleural effusion. Lungs appear clear, with no acute consolidative airspace disease and no pulmonary edema. Mild elevation of the left hemidiaphragm. Moderate gaseous distention of the stomach. Moderately dilated small bowel loops throughout the left abdomen measuring up to 4.5 cm diameter with air-fluid levels. Relative absence of small bowel gas in the right abdomen. Mild stool in the colon. No evidence of pneumatosis or pneumoperitoneum. IMPRESSION: 1. Moderately dilated small bowel loops with air-fluid levels in the left abdomen with relative absence of small bowel gas in the right abdomen, concerning for mid to distal small bowel obstruction. Recommend correlation with CT abdomen/pelvis with IV and oral contrast. 2. No active disease in the chest. Electronically Signed   By: Ilona Sorrel M.D.   On: 03/14/2016 07:47    Procedures Procedures (including critical care time)  Medications Ordered in ED Medications  HYDROmorphone (DILAUDID) injection 1 mg (1 mg Intravenous Given 03/14/16 1524)  hydrALAZINE (APRESOLINE) injection 10 mg (not administered)  metoprolol (LOPRESSOR) injection 5 mg (not administered)  morphine 2 MG/ML injection 2 mg (not administered)  Ampicillin-Sulbactam (UNASYN) 3 g in sodium chloride 0.9 % 100 mL IVPB (0 g Intravenous Stopped 03/14/16 1252)  ondansetron (ZOFRAN) tablet 4 mg (not administered)    Or  ondansetron (ZOFRAN) injection 4 mg (not administered)  heparin injection 5,000 Units (5,000 Units Subcutaneous Given 03/14/16 1337)  albuterol (PROVENTIL) (2.5 MG/3ML) 0.083% nebulizer solution 2.5 mg (not administered)  ondansetron (ZOFRAN) injection 4 mg (4 mg Intravenous Given 03/14/16 0639)  sodium chloride 0.9 % bolus 1,000 mL (0 mLs Intravenous Stopped 03/14/16 0710)  fentaNYL (SUBLIMAZE) injection 50 mcg (50 mcg  Intravenous Given 03/14/16 0641)  dicyclomine (BENTYL) injection 20 mg (20 mg Intramuscular Given 03/14/16 0652)  iopamidol (ISOVUE-300) 61 % injection 100 mL (100 mLs Intravenous Contrast Given 03/14/16 0838)     Initial Impression / Assessment and Plan / ED Course  I have reviewed the triage vital signs and the nursing notes.  Pertinent labs & imaging results that were available during my care of the patient were reviewed by me and considered in my medical decision making (see chart for details).  Clinical Course  Value Comment By Time  DG Abd Acute W/Chest (Reviewed) Margarita Mail, PA-C 08/12 [phone]   Patient with SBO.  Patient will be admitted to medicine and Surgery will consult. No active vomiting and pain is controlled. Appears safe for admission. Discussed findings with the patient.   Final Clinical Impressions(s) / ED Diagnoses   Final diagnoses:  SBO (small bowel obstruction) ([place])  New Prescriptions Current Discharge Medication List       Margarita Mail, PA-C 03/14/16 [place], MD 03/15/16 [phone]

## 2016-03-14 NOTE — ED Provider Notes (Signed)
  Face-to-face evaluation   History: Onset of the pain this morning. Also complains of constipation for several days. Last bowel movement yesterday containing "hard balls". No fever or vomiting.  Physical exam: Alert female female. Mild left upper and left lower quadrant tenderness. No rebound tenderness.  Medical screening examination/treatment/procedure(s) were conducted as a shared visit with non-physician practitioner(s) and myself.  I personally evaluated the patient during the encounter   Daleen Bo, MD 03/15/16 [phone]

## 2016-03-14 NOTE — H&P (Signed)
[facility] H&P   Patient Demographics:    Kelly Mccann, is a 70 y.o. female  MRN: [id number]   DOB - 1945-10-02  Admit Date - 03/14/2016  Outpatient Primary MD for the patient is [place]    Patient coming from: Home  Chief Complaint  Patient presents with  . Abdominal Pain      HPI:    Kelly Mccann  is a 69 y.o. female, History of hypertension, dyslipidemia, chronic constipation who comes to the hospital with 1 day history of generalized abdominal pain mostly in the lower quadrants with some nausea, no emesis, denies any diarrhea, no fever chills, came to the ER with this problem in the ER CT scan of the abdomen suggestive of small bowel obstruction with possible inflammation or infectious colitis. Gen. surgery was consulted and I was requested to admit the patient. Besides above review of systems all other review of systems are negative.    Review of systems:    In addition to the HPI above,   No Fever-chills, No Headache, No changes with Vision or hearing, No problems swallowing food or Liquids, No Chest pain, Cough or Shortness of Breath, GI symptoms as above, No Blood in stool or Urine, No dysuria, No new skin rashes or bruises, No new joints pains-aches,  No new weakness, tingling, numbness in any extremity, No recent weight gain or loss, No polyuria, polydypsia or polyphagia, No significant Mental Stressors.  A full 10 point Review of Systems was done, except as stated above, all other Review of Systems were negative.   With Past History of the following :    Past Medical History:  Diagnosis Date  . Hypercholesterolemia   . Hypertension       Past Surgical History:    Procedure Laterality Date  . ABDOMINAL HYSTERECTOMY        Social History:     Social History  Substance Use Topics  . Smoking status: Former Smoker    Types: Cigarettes    Quit date: 08/18/1999  . Smokeless tobacco: Never Used  . Alcohol use No         Family History :   NO Personal family history of colon cancer   Home Medications:   Prior to Admission medications   Medication Sig Start Date End Date Taking? Authorizing  Provider  Coenzyme Q10 (CO Q 10) 100 MG CAPS Take 100 mg by mouth 2 (two) times daily.   Yes Historical Provider, MD  Flaxseed, Linseed, (FLAXSEED OIL) 1000 MG CAPS Take 1,000 mg by mouth 2 (two) times daily.   Yes Historical Provider, MD  Garlic [id number] MG CAPS Take 1,000 mg by mouth 2 (two) times daily.    Yes Historical Provider, MD  lovastatin (MEVACOR) 20 MG tablet Take 20 mg by mouth daily.   Yes Historical Provider, MD  Multiple Vitamins-Minerals (MULTIVITAMIN ADULTS 50+ PO) Take 1 tablet by mouth daily.   Yes Historical Provider, MD  TURMERIC PO Take 1.25 mLs by mouth daily.   Yes Historical Provider, MD     Allergies:    No Known Allergies   Physical Exam:   Vitals  Blood pressure (!) 160/101, pulse 105, temperature 98.1 F (36.7 C), temperature source Oral, resp. rate 21, height 5\' 6"  (1.676 m), weight 88.5 kg (195 lb), SpO2 100 %.   1. General Pleasant middle-aged African-American female lying in hospital bed in no apparent distress  2. Normal affect and insight, Not Suicidal or Homicidal, Awake Alert, Oriented X 3.  3. No F.N deficits, ALL C.Nerves Intact, Strength 5/5 all 4 extremities, Sensation intact all 4 extremities, Plantars down going.  4. Ears and Eyes appear Normal, Conjunctivae clear, PERRLA. Moist Oral Mucosa.  5. Supple Neck, No JVD, No cervical lymphadenopathy appriciated, No Carotid Bruits.  6. Symmetrical Chest wall movement, Good air movement bilaterally, CTAB.  7. RRR, No Gallops, Rubs or Murmurs, No Parasternal  Heave.  8. Positive Bowel Sounds, Abdomen Soft with minimal to no distention, mild generalized tenderness, No organomegaly appriciated,No rebound -guarding or rigidity.  9.  No Cyanosis, Normal Skin Turgor, No Skin Rash or Bruise.  10. Good muscle tone,  joints appear normal , no effusions, Normal ROM.  11. No Palpable Lymph Nodes in Neck or Axillae      Data Review:    CBC  Recent Labs Lab 03/14/16 0639  WBC 8.6  HGB 16.8*  HCT 51.3*  PLT 285  MCV 94.5  MCH 30.9  MCHC 32.7  RDW 14.3  LYMPHSABS 1.7  MONOABS 0.3  EOSABS 0.0  BASOSABS 0.0   ------------------------------------------------------------------------------------------------------------------  Chemistries   Recent Labs Lab 03/14/16 0716  NA 139  K 5.1  CL 108  CO2 22  GLUCOSE 154*  BUN 21*  CREATININE 1.06*  CALCIUM 8.5*  AST 40  ALT 23  ALKPHOS 64  BILITOT 0.9   ------------------------------------------------------------------------------------------------------------------ estimated creatinine clearance is 56.9 mL/min (by C-G formula based on SCr of 1.06 mg/dL). ------------------------------------------------------------------------------------------------------------------ No results for input(s): TSH, T4TOTAL, T3FREE, THYROIDAB in the last 72 hours.  Invalid input(s): FREET3  Coagulation profile No results for input(s): INR, PROTIME in the last 168 hours. ------------------------------------------------------------------------------------------------------------------- No results for input(s): DDIMER in the last 72 hours. -------------------------------------------------------------------------------------------------------------------  Cardiac Enzymes  Recent Labs Lab 03/14/16 0716  TROPONINI <0.03   ------------------------------------------------------------------------------------------------------------------ No results found for:  BNP   ---------------------------------------------------------------------------------------------------------------  Urinalysis No results found for: COLORURINE, APPEARANCEUR, LABSPEC, PHURINE, GLUCOSEU, HGBUR, BILIRUBINUR, KETONESUR, PROTEINUR, UROBILINOGEN, NITRITE, LEUKOCYTESUR  ----------------------------------------------------------------------------------------------------------------   Imaging Results:    Ct Abdomen Pelvis W Contrast  Result Date: 03/14/2016 CLINICAL DATA:  Mid abdominal pain.  Nausea.  Hysterectomy. EXAM: CT ABDOMEN AND PELVIS WITH CONTRAST TECHNIQUE: Multidetector CT imaging of the abdomen and pelvis was performed using the standard protocol following bolus administration of intravenous contrast. CONTRAST:  146mL ISOVUE-300 IOPAMIDOL (ISOVUE-300) INJECTION 61% COMPARISON:  Abdominal radiographs from earlier today. FINDINGS: Lower chest: No significant pulmonary nodules or acute consolidative airspace disease. Right coronary atherosclerosis. Hepatobiliary: Normal liver with no liver mass. Normal gallbladder with no radiopaque cholelithiasis. No biliary ductal dilatation. Pancreas: Normal, with no mass or duct dilation. Spleen: Normal size spleen. Calcified splenic granulomas. No splenic mass. Adrenals/Urinary Tract: Mild diffuse adrenal thickening bilaterally with no discrete adrenal nodules, suggesting mild adrenal hyperplasia. Normal kidneys with no hydronephrosis and no renal mass. Normal bladder. Stomach/Bowel: Grossly normal stomach. There is a long segment of thick-walled ileum in the right abdomen, proximal to which the small bowel is mildly to moderately dilated and fluid-filled, including small bowel stool sign in the right abdomen. No small bowel mass or pneumatosis. Normal appendix. Relatively collapsed and normal large bowel with no large bowel wall thickening or pericolonic fat stranding. Vascular/Lymphatic: Atherosclerotic nonaneurysmal abdominal aorta. Patent  portal, splenic, hepatic and renal veins. No pathologically enlarged lymph nodes in the abdomen or pelvis. Reproductive: Status post hysterectomy, with no abnormal findings at the vaginal cuff. No adnexal mass. Other: No pneumoperitoneum. Trace perihepatic ascites. Small volume ascites in the pelvic cul-de-sac. Musculoskeletal: No aggressive appearing focal osseous lesions. Mild thoracolumbar spondylosis. IMPRESSION: 1. CT findings suggest a mid to distal small bowel obstruction proximal to a long thick-walled segment of ileum in the right abdomen. This may represent a functional small-bowel obstruction due to infectious or inflammatory ileitis or ischemia. No pneumatosis or pneumoperitoneum. 2. Trace ascites. 3. Aortic atherosclerosis.  Coronary atherosclerosis. Electronically Signed   By: Ilona Sorrel M.D.   On: 03/14/2016 09:09   Dg Abd Acute W/chest  Result Date: 03/14/2016 CLINICAL DATA:  Abdominal pain.  Nausea.  Constipation. EXAM: DG ABDOMEN ACUTE W/ 1V CHEST COMPARISON:  08/18/2015 chest radiograph. FINDINGS: Stable cardiomediastinal silhouette with normal heart size. No pneumothorax. No pleural effusion. Lungs appear clear, with no acute consolidative airspace disease and no pulmonary edema. Mild elevation of the left hemidiaphragm. Moderate gaseous distention of the stomach. Moderately dilated small bowel loops throughout the left abdomen measuring up to 4.5 cm diameter with air-fluid levels. Relative absence of small bowel gas in the right abdomen. Mild stool in the colon. No evidence of pneumatosis or pneumoperitoneum. IMPRESSION: 1. Moderately dilated small bowel loops with air-fluid levels in the left abdomen with relative absence of small bowel gas in the right abdomen, concerning for mid to distal small bowel obstruction. Recommend correlation with CT abdomen/pelvis with IV and oral contrast. 2. No active disease in the chest. Electronically Signed   By: Ilona Sorrel M.D.   On: 03/14/2016 07:47     My personal review of EKG: Rhythm NSR,  no Acute ST changes   Assessment & Plan:      1. Small bowel obstruction. Likely functional due to infection, inflammation or ischemia. Gen. surgery consulted, nothing by mouth, IV fluids, supportive care, IV Unasyn, she has no diarrhea, no history of A. fib or hypercoagulable state or PAD. We'll defer further management to general surgery. She is currently not nauseated and abdomen is not distended hence will avoid NG for now.  2. Hypertension and dyslipidemia. Hold by mouth medications, IV hydralazine and Lopressor as needed.   DVT Prophylaxis Heparin    AM Labs Ordered, also please review Full Orders  Family Communication: Admission, patients condition and plan of care including tests being ordered have been discussed with the patient and who indicates understanding and agree with the plan and Code Status.  Code Status Full  Likely DC  to  Home 1-2 days  Condition Fair  Consults called: [facility]CS    Admission status: Inpt    Time spent in minutes : 35   Lala Lund K M.D on 03/14/2016 at 10:28 AM  Between 7am to 7pm - Pager - [phone]. After 7pm go to [email or web address] - password [facility]  [facility] - Office  [phone]

## 2016-03-14 NOTE — ED Triage Notes (Signed)
Pt c/o mid abd pain onset last night @2100 , +nausea. Took laxative and pepto with little relief.

## 2016-03-14 NOTE — ED Notes (Signed)
Nurse will attempt to draw labs with IV start. 

## 2016-03-15 ENCOUNTER — Inpatient Hospital Stay (HOSPITAL_COMMUNITY): Payer: Medicare Other

## 2016-03-15 LAB — BASIC METABOLIC PANEL
ANION GAP: 5 (ref 5–15)
Anion gap: 5 (ref 5–15)
BUN: 20 mg/dL (ref 6–20)
BUN: 20 mg/dL (ref 6–20)
CHLORIDE: 109 mmol/L (ref 101–111)
CHLORIDE: 110 mmol/L (ref 101–111)
CO2: 28 mmol/L (ref 22–32)
CO2: 28 mmol/L (ref 22–32)
CREATININE: 1.16 mg/dL — AB (ref 0.44–1.00)
Calcium: 8.2 mg/dL — ABNORMAL LOW (ref 8.9–10.3)
Calcium: 8.7 mg/dL — ABNORMAL LOW (ref 8.9–10.3)
Creatinine, Ser: 1.1 mg/dL — ABNORMAL HIGH (ref 0.44–1.00)
GFR calc Af Amer: 58 mL/min — ABNORMAL LOW (ref >60)
GFR calc non Af Amer: 47 mL/min — ABNORMAL LOW (ref >60)
GFR calc non Af Amer: 50 mL/min — ABNORMAL LOW (ref >60)
GFR, EST AFRICAN AMERICAN: 55 mL/min — AB (ref >60)
GLUCOSE: 105 mg/dL — AB (ref 65–99)
Glucose, Bld: 115 mg/dL — ABNORMAL HIGH (ref 65–99)
POTASSIUM: 3.6 mmol/L (ref 3.5–5.1)
POTASSIUM: 4 mmol/L (ref 3.5–5.1)
Sodium: 142 mmol/L (ref 135–145)
Sodium: 143 mmol/L (ref 135–145)

## 2016-03-15 LAB — CBC
HEMATOCRIT: 40.2 % (ref 36.0–46.0)
HEMOGLOBIN: 12.6 g/dL (ref 12.0–15.0)
MCH: 30.5 pg (ref 26.0–34.0)
MCHC: 31.3 g/dL (ref 30.0–36.0)
MCV: 97.3 fL (ref 78.0–100.0)
PLATELETS: 227 10*3/uL (ref 150–400)
RBC: 4.13 MIL/uL (ref 3.87–5.11)
RDW: 14.6 % (ref 11.5–15.5)
WBC: 6 10*3/uL (ref 4.0–10.5)

## 2016-03-15 MED ORDER — KCL IN DEXTROSE-NACL 20-5-0.45 MEQ/L-%-% IV SOLN
INTRAVENOUS | Status: DC
  Filled 2016-03-15: qty 1000

## 2016-03-15 MED ORDER — KCL IN DEXTROSE-NACL 20-5-0.45 MEQ/L-%-% IV SOLN
INTRAVENOUS | Status: AC
Start: 2016-03-15 — End: 2016-03-16
  Administered 2016-03-15: 1000 mL via INTRAVENOUS
  Administered 2016-03-15 – 2016-03-16 (×2): via INTRAVENOUS
  Filled 2016-03-15 (×2): qty 1000

## 2016-03-15 NOTE — Progress Notes (Addendum)
[facility]:  [phone] General Surgery Progress Note   LOS: 1 day  POD -     Assessment/Plan: 1.  SBO  Suggestion of inflammation of ileum on CT scan  On Unasyn 03/31/23 >>>  Has passed flatus, though a little sore in RLQ.  KUB - shows improved gas pattern.  Will start clear liquids   2. K+ borderline low  She was on just Proctor IVF - I increased IVF rate and added K+ 3.  Atherlsclerosis/CAD on CT scan  4.  DVT prophylaxis - SQ Heparin   Principal Problem:   SBO (small bowel obstruction) (HCC) Active Problems:   Hypercholesterolemia   Hypertension   Subjective:  Feels better.   Has passed flatus, though a little sore in RLQ.  Objective:   Vitals:   03-30-2016 2039 03/15/16 0609  BP: 128/78 (!) 141/92  Pulse: 92 85  Resp: 16 16  Temp: 98.3 F (36.8 C) 98.4 F (36.9 C)     Intake/Output from previous day:  31-Mar-2023 0701 - 08/13 0700 In: 200 [IV Piggyback:200] Out: -   Intake/Output this shift:  No intake/output data recorded.   Physical Exam:   General: WN AA F who is alert and oriented.    HEENT: Normal. Pupils equal. .   Lungs: Clear   Abdomen: Soft.  Somewhat sore in RLQ.   Lab Results:    Recent Labs  2016-03-30 1430 03/15/16 0358  WBC 8.8 6.0  HGB 15.3* 12.6  HCT 46.9* 40.2  PLT 309 227    BMET   Recent Labs  March 30, 2016 0716 03-30-16 1430 03/15/16 0358  NA 139  --  142  K 5.1  --  3.6  CL 108  --  109  CO2 22  --  28  GLUCOSE 154*  --  115*  BUN 21*  --  20  CREATININE 1.06* 1.20* 1.16*  CALCIUM 8.5*  --  8.2*    PT/INR   Recent Labs  03/30/2016 1430  LABPROT 13.2  INR 1.00    ABG  No results for input(s): PHART, HCO3 in the last 72 hours.  Invalid input(s): PCO2, PO2   Studies/Results:  Ct Abdomen Pelvis W Contrast  Result Date: March 30, 2016 CLINICAL DATA:  Mid abdominal pain.  Nausea.  Hysterectomy. EXAM: CT ABDOMEN AND PELVIS WITH CONTRAST TECHNIQUE: Multidetector CT imaging of the abdomen and pelvis was  performed using the standard protocol following bolus administration of intravenous contrast. CONTRAST:  189mL ISOVUE-300 IOPAMIDOL (ISOVUE-300) INJECTION 61% COMPARISON:  Abdominal radiographs from earlier today. FINDINGS: Lower chest: No significant pulmonary nodules or acute consolidative airspace disease. Right coronary atherosclerosis. Hepatobiliary: Normal liver with no liver mass. Normal gallbladder with no radiopaque cholelithiasis. No biliary ductal dilatation. Pancreas: Normal, with no mass or duct dilation. Spleen: Normal size spleen. Calcified splenic granulomas. No splenic mass. Adrenals/Urinary Tract: Mild diffuse adrenal thickening bilaterally with no discrete adrenal nodules, suggesting mild adrenal hyperplasia. Normal kidneys with no hydronephrosis and no renal mass. Normal bladder. Stomach/Bowel: Grossly normal stomach. There is a long segment of thick-walled ileum in the right abdomen, proximal to which the small bowel is mildly to moderately dilated and fluid-filled, including small bowel stool sign in the right abdomen. No small bowel mass or pneumatosis. Normal appendix. Relatively collapsed and normal large bowel with no large bowel wall thickening or pericolonic fat stranding. Vascular/Lymphatic: Atherosclerotic nonaneurysmal abdominal aorta. Patent portal, splenic, hepatic and renal veins. No pathologically enlarged lymph nodes in the abdomen or pelvis. Reproductive:  Status post hysterectomy, with no abnormal findings at the vaginal cuff. No adnexal mass. Other: No pneumoperitoneum. Trace perihepatic ascites. Small volume ascites in the pelvic cul-de-sac. Musculoskeletal: No aggressive appearing focal osseous lesions. Mild thoracolumbar spondylosis. IMPRESSION: 1. CT findings suggest a mid to distal small bowel obstruction proximal to a long thick-walled segment of ileum in the right abdomen. This may represent a functional small-bowel obstruction due to infectious or inflammatory ileitis or  ischemia. No pneumatosis or pneumoperitoneum. 2. Trace ascites. 3. Aortic atherosclerosis.  Coronary atherosclerosis. Electronically Signed   By: Ilona Sorrel M.D.   On: 03/14/2016 09:09   Dg Abd Acute W/chest  Result Date: 03/14/2016 CLINICAL DATA:  Abdominal pain.  Nausea.  Constipation. EXAM: DG ABDOMEN ACUTE W/ 1V CHEST COMPARISON:  08/18/2015 chest radiograph. FINDINGS: Stable cardiomediastinal silhouette with normal heart size. No pneumothorax. No pleural effusion. Lungs appear clear, with no acute consolidative airspace disease and no pulmonary edema. Mild elevation of the left hemidiaphragm. Moderate gaseous distention of the stomach. Moderately dilated small bowel loops throughout the left abdomen measuring up to 4.5 cm diameter with air-fluid levels. Relative absence of small bowel gas in the right abdomen. Mild stool in the colon. No evidence of pneumatosis or pneumoperitoneum. IMPRESSION: 1. Moderately dilated small bowel loops with air-fluid levels in the left abdomen with relative absence of small bowel gas in the right abdomen, concerning for mid to distal small bowel obstruction. Recommend correlation with CT abdomen/pelvis with IV and oral contrast. 2. No active disease in the chest. Electronically Signed   By: Ilona Sorrel M.D.   On: 03/14/2016 07:47     Anti-infectives:   Anti-infectives    Start     Dose/Rate Route Frequency Ordered Stop   03/14/16 1100  Ampicillin-Sulbactam (UNASYN) 3 g in sodium chloride 0.9 % 100 mL IVPB     3 g 100 mL/hr over 60 Minutes Intravenous Every 6 hours 03/14/16 1028        Alphonsa Overall, MD, FACS Pager: [facility]: [phone] 03/15/2016

## 2016-03-15 NOTE — Progress Notes (Signed)
PROGRESS NOTE                                                                                                                                                                                                             Patient Demographics:    Kelly Mccann, is a 70 y.o. female, DOB - 1946-04-19, AQ:[id number]  Admit date - 03/14/2016   Admitting Physician Thurnell Lose, MD  Outpatient Primary MD for the patient is [place]  LOS - 1  Chief Complaint  Patient presents with  . Abdominal Pain       Brief Narrative    Kelly Mccann  is a 70 y.o. female, History of hypertension, dyslipidemia, chronic constipation who comes to the hospital with 1 day history of generalized abdominal pain mostly in the lower quadrants with some nausea, no emesis, denies any diarrhea, no fever chills, came to the ER with this problem in the ER CT scan of the abdomen suggestive of small bowel obstruction with possible inflammation or infectious colitis. Gen. surgery was consulted and I was requested to admit the patient. Besides above review of systems all other review of systems are negative.     Subjective:    Kelly Mccann today has, No headache, No chest pain, passing flatus and improved abdominal pain - No Nausea, No new weakness tingling or numbness, No Cough - SOB.     Assessment  & Plan :     1. Small bowel obstruction. Likely functional due to infection, inflammation or ischemia. Gen. surgery consulted, improved with bowel rest, Unasyn, passing flatus and pain almost resolved, will defer further management to [facility].  2. Hypertension and dyslipidemia. Hold by mouth medications, IV hydralazine and Lopressor as needed.    Family Communication  :  None  Code Status :  Full  Diet : NPO   Disposition Plan  :  Stay inpt  Consults  :  [facility]  Procedures  :  CT Abd Pelvis - SBO   DVT Prophylaxis  :  Heparin     Lab Results  Component Value Date   PLT 227 03/15/2016    Inpatient Medications  Scheduled Meds: . ampicillin-sulbactam (UNASYN) IV  3 g Intravenous Q6H  . heparin  5,000 Units Subcutaneous Q8H   Continuous Infusions: . dextrose 5 % and 0.45 %  NaCl with KCl 20 mEq/L     PRN Meds:.albuterol, hydrALAZINE, HYDROmorphone (DILAUDID) injection, metoprolol, morphine injection, ondansetron **OR** ondansetron (ZOFRAN) IV  Antibiotics  :    Anti-infectives    Start     Dose/Rate Route Frequency Ordered Stop   03/14/16 1100  Ampicillin-Sulbactam (UNASYN) 3 g in sodium chloride 0.9 % 100 mL IVPB     3 g 100 mL/hr over 60 Minutes Intravenous Every 6 hours 03/14/16 1028           Objective:   Vitals:   03/14/16 1308 03/14/16 2039 03/15/16 0609 03/15/16 0651  BP: (!) 159/94 128/78 (!) 141/92   Pulse: 90 92 85   Resp: 18 16 16    Temp: 97.7 F (36.5 C) 98.3 F (36.8 C) 98.4 F (36.9 C)   TempSrc: Oral Oral Oral   SpO2: 98% 93% 93%   Weight: 88.7 kg (195 lb 8 oz)   87.6 kg (193 lb 2 oz)  Height: 5\' 6"  (1.676 m)       Wt Readings from Last 3 Encounters:  03/15/16 87.6 kg (193 lb 2 oz)     Intake/Output Summary (Last 24 hours) at 03/15/16 0806 Last data filed at 03/14/16 1758  Gross per 24 hour  Intake              200 ml  Output                0 ml  Net              200 ml     Physical Exam  Awake Alert, Oriented X 3, No new F.N deficits, Normal affect [place].AT,PERRAL Supple Neck,No JVD, No cervical lymphadenopathy appriciated.  Symmetrical Chest wall movement, Good air movement bilaterally, CTAB RRR,No Gallops,Rubs or new Murmurs, No Parasternal Heave +ve B.Sounds, Abd Soft, No tenderness, No organomegaly appriciated, No rebound - guarding or rigidity. No Cyanosis, Clubbing or edema, No new Rash or bruise      Data Review:    CBC  Recent Labs Lab 03/14/16 0639 03/14/16 1430 03/15/16 0358  WBC 8.6 8.8 6.0  HGB 16.8* 15.3* 12.6  HCT 51.3* 46.9* 40.2  PLT  285 309 227  MCV 94.5 95.9 97.3  MCH 30.9 31.3 30.5  MCHC 32.7 32.6 31.3  RDW 14.3 14.4 14.6  LYMPHSABS 1.7  --   --   MONOABS 0.3  --   --   EOSABS 0.0  --   --   BASOSABS 0.0  --   --     Chemistries   Recent Labs Lab 03/14/16 0716 03/14/16 1430 03/15/16 0358  NA 139  --  142  K 5.1  --  3.6  CL 108  --  109  CO2 22  --  28  GLUCOSE 154*  --  115*  BUN 21*  --  20  CREATININE 1.06* 1.20* 1.16*  CALCIUM 8.5*  --  8.2*  AST 40  --   --   ALT 23  --   --   ALKPHOS 64  --   --   BILITOT 0.9  --   --    ------------------------------------------------------------------------------------------------------------------ No results for input(s): CHOL, HDL, LDLCALC, TRIG, CHOLHDL, LDLDIRECT in the last 72 hours.  No results found for: HGBA1C ------------------------------------------------------------------------------------------------------------------ No results for input(s): TSH, T4TOTAL, T3FREE, THYROIDAB in the last 72 hours.  Invalid input(s): FREET3 ------------------------------------------------------------------------------------------------------------------ No results for input(s): VITAMINB12, FOLATE, FERRITIN, TIBC, IRON, RETICCTPCT in the last 72 hours.  Coagulation profile  Recent Labs Lab 03/14/16 1430  INR 1.00    No results for input(s): DDIMER in the last 72 hours.  Cardiac Enzymes  Recent Labs Lab 03/14/16 0716  TROPONINI <0.03   ------------------------------------------------------------------------------------------------------------------ No results found for: BNP  Micro Results No results found for this or any previous visit (from the past 240 hour(s)).  Radiology Reports Ct Abdomen Pelvis W Contrast  Result Date: 03/14/2016 CLINICAL DATA:  Mid abdominal pain.  Nausea.  Hysterectomy. EXAM: CT ABDOMEN AND PELVIS WITH CONTRAST TECHNIQUE: Multidetector CT imaging of the abdomen and pelvis was performed using the standard protocol  following bolus administration of intravenous contrast. CONTRAST:  132mL ISOVUE-300 IOPAMIDOL (ISOVUE-300) INJECTION 61% COMPARISON:  Abdominal radiographs from earlier today. FINDINGS: Lower chest: No significant pulmonary nodules or acute consolidative airspace disease. Right coronary atherosclerosis. Hepatobiliary: Normal liver with no liver mass. Normal gallbladder with no radiopaque cholelithiasis. No biliary ductal dilatation. Pancreas: Normal, with no mass or duct dilation. Spleen: Normal size spleen. Calcified splenic granulomas. No splenic mass. Adrenals/Urinary Tract: Mild diffuse adrenal thickening bilaterally with no discrete adrenal nodules, suggesting mild adrenal hyperplasia. Normal kidneys with no hydronephrosis and no renal mass. Normal bladder. Stomach/Bowel: Grossly normal stomach. There is a long segment of thick-walled ileum in the right abdomen, proximal to which the small bowel is mildly to moderately dilated and fluid-filled, including small bowel stool sign in the right abdomen. No small bowel mass or pneumatosis. Normal appendix. Relatively collapsed and normal large bowel with no large bowel wall thickening or pericolonic fat stranding. Vascular/Lymphatic: Atherosclerotic nonaneurysmal abdominal aorta. Patent portal, splenic, hepatic and renal veins. No pathologically enlarged lymph nodes in the abdomen or pelvis. Reproductive: Status post hysterectomy, with no abnormal findings at the vaginal cuff. No adnexal mass. Other: No pneumoperitoneum. Trace perihepatic ascites. Small volume ascites in the pelvic cul-de-sac. Musculoskeletal: No aggressive appearing focal osseous lesions. Mild thoracolumbar spondylosis. IMPRESSION: 1. CT findings suggest a mid to distal small bowel obstruction proximal to a long thick-walled segment of ileum in the right abdomen. This may represent a functional small-bowel obstruction due to infectious or inflammatory ileitis or ischemia. No pneumatosis or  pneumoperitoneum. 2. Trace ascites. 3. Aortic atherosclerosis.  Coronary atherosclerosis. Electronically Signed   By: Ilona Sorrel M.D.   On: 03/14/2016 09:09   Dg Abd Acute W/chest  Result Date: 03/14/2016 CLINICAL DATA:  Abdominal pain.  Nausea.  Constipation. EXAM: DG ABDOMEN ACUTE W/ 1V CHEST COMPARISON:  08/18/2015 chest radiograph. FINDINGS: Stable cardiomediastinal silhouette with normal heart size. No pneumothorax. No pleural effusion. Lungs appear clear, with no acute consolidative airspace disease and no pulmonary edema. Mild elevation of the left hemidiaphragm. Moderate gaseous distention of the stomach. Moderately dilated small bowel loops throughout the left abdomen measuring up to 4.5 cm diameter with air-fluid levels. Relative absence of small bowel gas in the right abdomen. Mild stool in the colon. No evidence of pneumatosis or pneumoperitoneum. IMPRESSION: 1. Moderately dilated small bowel loops with air-fluid levels in the left abdomen with relative absence of small bowel gas in the right abdomen, concerning for mid to distal small bowel obstruction. Recommend correlation with CT abdomen/pelvis with IV and oral contrast. 2. No active disease in the chest. Electronically Signed   By: Ilona Sorrel M.D.   On: 03/14/2016 07:47    Time Spent in minutes  30   SINGH,PRASHANT K M.D on 03/15/2016 at 8:06 AM  Between 7am to 7pm - Pager - [phone]  After 7pm go to [email or web address] - password [facility]  [facility] -  Office  [phone]

## 2016-03-16 LAB — URINALYSIS, ROUTINE W REFLEX MICROSCOPIC
Bilirubin Urine: NEGATIVE
Glucose, UA: NEGATIVE mg/dL
HGB URINE DIPSTICK: NEGATIVE
Ketones, ur: NEGATIVE mg/dL
Leukocytes, UA: NEGATIVE
Nitrite: NEGATIVE
PROTEIN: NEGATIVE mg/dL
SPECIFIC GRAVITY, URINE: 1.011 (ref 1.005–1.030)
pH: 8 (ref 5.0–8.0)

## 2016-03-16 MED ORDER — ZOLPIDEM TARTRATE 5 MG PO TABS
5.0000 mg | ORAL_TABLET | Freq: Every evening | ORAL | Status: DC | PRN
  Administered 2016-03-16: 5 mg via ORAL
  Filled 2016-03-16: qty 1

## 2016-03-16 MED ORDER — PSYLLIUM 95 % PO PACK
1.0000 | PACK | Freq: Two times a day (BID) | ORAL | Status: DC
  Administered 2016-03-16 – 2016-03-17 (×3): 1 via ORAL
  Filled 2016-03-16 (×3): qty 1

## 2016-03-16 NOTE — Progress Notes (Signed)
PROGRESS NOTE                                                                                                                                                                                                             Patient Demographics:    Kelly Mccann, is a 70 y.o. female, DOB - 1946-04-01, AC:[id number]  Admit date - 03/14/2016   Admitting Physician Thurnell Lose, MD  Outpatient Primary MD for the patient is [place]  LOS - 2  Chief Complaint  Patient presents with  . Abdominal Pain       Brief Narrative    Kelly Mccann  is a 70 y.o. female, History of hypertension, dyslipidemia, chronic constipation who comes to the hospital with 1 day history of generalized abdominal pain mostly in the lower quadrants with some nausea, no emesis, denies any diarrhea, no fever chills, came to the ER with this problem in the ER CT scan of the abdomen suggestive of small bowel obstruction with possible inflammation or infectious colitis. Gen. surgery was consulted and I was requested to admit the patient. Besides above review of systems all other review of systems are negative.     Subjective:    Kelly Mccann today has, No headache, No chest pain, passing flatus and improved abdominal pain - No Nausea, No new weakness tingling or numbness, No Cough - SOB.     Assessment  & Plan :     1. Small bowel obstruction. Likely functional due to infection, inflammation or ischemia. Gen. surgery consulted, improved with bowel rest, Unasyn, passing flatus and pain almost resolved, will defer further management to [facility].  2. Hypertension and dyslipidemia. Hold by mouth medications, IV hydralazine and Lopressor as needed.    Family Communication  :  None  Code Status :  Full  Diet : NPO   Disposition Plan  :  Stay inpt  Consults  :  [facility]  Procedures  :  CT Abd Pelvis - SBO   DVT Prophylaxis  :  Heparin     Lab Results  Component Value Date   PLT 227 03/15/2016    Inpatient Medications  Scheduled Meds: . ampicillin-sulbactam (UNASYN) IV  3 g Intravenous Q6H  . heparin  5,000 Units Subcutaneous Q8H  . psyllium  1 packet Oral BID   Continuous  Infusions:   PRN Meds:.albuterol, hydrALAZINE, HYDROmorphone (DILAUDID) injection, metoprolol, morphine injection, ondansetron **OR** ondansetron (ZOFRAN) IV  Antibiotics  :    Anti-infectives    Start     Dose/Rate Route Frequency Ordered Stop   03/14/16 1100  Ampicillin-Sulbactam (UNASYN) 3 g in sodium chloride 0.9 % 100 mL IVPB     3 g 100 mL/hr over 60 Minutes Intravenous Every 6 hours 03/14/16 1028           Objective:   Vitals:   03/15/16 0651 03/15/16 1427 03/15/16 2136 03/16/16 0449  BP:  (!) 154/94 (!) 135/98 (!) 119/95  Pulse:  86 79 83  Resp:  16 16 16   Temp:  98.4 F (36.9 C) 98.3 F (36.8 C) 98.1 F (36.7 C)  TempSrc:  Oral Oral Oral  SpO2:  95% 99% 96%  Weight: 87.6 kg (193 lb 2 oz)   90.7 kg (199 lb 15.3 oz)  Height:        Wt Readings from Last 3 Encounters:  03/16/16 90.7 kg (199 lb 15.3 oz)     Intake/Output Summary (Last 24 hours) at 03/16/16 0944 Last data filed at 03/16/16 0800  Gross per 24 hour  Intake             2683 ml  Output             1650 ml  Net             1033 ml     Physical Exam  Awake Alert, Oriented X 3, No new F.N deficits, Normal affect [place].AT,PERRAL Supple Neck,No JVD, No cervical lymphadenopathy appriciated.  Symmetrical Chest wall movement, Good air movement bilaterally, CTAB RRR,No Gallops,Rubs or new Murmurs, No Parasternal Heave +ve B.Sounds, Abd Soft, No tenderness, No organomegaly appriciated, No rebound - guarding or rigidity. No Cyanosis, Clubbing or edema, No new Rash or bruise      Data Review:    CBC  Recent Labs Lab 03/14/16 0639 03/14/16 1430 03/15/16 0358  WBC 8.6 8.8 6.0  HGB 16.8* 15.3* 12.6  HCT 51.3* 46.9* 40.2  PLT 285 309 227  MCV 94.5  95.9 97.3  MCH 30.9 31.3 30.5  MCHC 32.7 32.6 31.3  RDW 14.3 14.4 14.6  LYMPHSABS 1.7  --   --   MONOABS 0.3  --   --   EOSABS 0.0  --   --   BASOSABS 0.0  --   --     Chemistries   Recent Labs Lab 03/14/16 0716 03/14/16 1430 03/15/16 0358 03/15/16 0856  NA 139  --  142 143  K 5.1  --  3.6 4.0  CL 108  --  109 110  CO2 22  --  28 28  GLUCOSE 154*  --  115* 105*  BUN 21*  --  20 20  CREATININE 1.06* 1.20* 1.16* 1.10*  CALCIUM 8.5*  --  8.2* 8.7*  AST 40  --   --   --   ALT 23  --   --   --   ALKPHOS 64  --   --   --   BILITOT 0.9  --   --   --    ------------------------------------------------------------------------------------------------------------------ No results for input(s): CHOL, HDL, LDLCALC, TRIG, CHOLHDL, LDLDIRECT in the last 72 hours.  No results found for: HGBA1C ------------------------------------------------------------------------------------------------------------------ No results for input(s): TSH, T4TOTAL, T3FREE, THYROIDAB in the last 72 hours.  Invalid input(s): FREET3 ------------------------------------------------------------------------------------------------------------------ No results for input(s): VITAMINB12, FOLATE, FERRITIN, TIBC, IRON,  RETICCTPCT in the last 72 hours.  Coagulation profile  Recent Labs Lab 03/14/16 1430  INR 1.00    No results for input(s): DDIMER in the last 72 hours.  Cardiac Enzymes  Recent Labs Lab 03/14/16 0716  TROPONINI <0.03   ------------------------------------------------------------------------------------------------------------------ No results found for: BNP  Micro Results No results found for this or any previous visit (from the past 240 hour(s)).  Radiology Reports Ct Abdomen Pelvis W Contrast  Result Date: 03/14/2016 CLINICAL DATA:  Mid abdominal pain.  Nausea.  Hysterectomy. EXAM: CT ABDOMEN AND PELVIS WITH CONTRAST TECHNIQUE: Multidetector CT imaging of the abdomen and pelvis  was performed using the standard protocol following bolus administration of intravenous contrast. CONTRAST:  152mL ISOVUE-300 IOPAMIDOL (ISOVUE-300) INJECTION 61% COMPARISON:  Abdominal radiographs from earlier today. FINDINGS: Lower chest: No significant pulmonary nodules or acute consolidative airspace disease. Right coronary atherosclerosis. Hepatobiliary: Normal liver with no liver mass. Normal gallbladder with no radiopaque cholelithiasis. No biliary ductal dilatation. Pancreas: Normal, with no mass or duct dilation. Spleen: Normal size spleen. Calcified splenic granulomas. No splenic mass. Adrenals/Urinary Tract: Mild diffuse adrenal thickening bilaterally with no discrete adrenal nodules, suggesting mild adrenal hyperplasia. Normal kidneys with no hydronephrosis and no renal mass. Normal bladder. Stomach/Bowel: Grossly normal stomach. There is a long segment of thick-walled ileum in the right abdomen, proximal to which the small bowel is mildly to moderately dilated and fluid-filled, including small bowel stool sign in the right abdomen. No small bowel mass or pneumatosis. Normal appendix. Relatively collapsed and normal large bowel with no large bowel wall thickening or pericolonic fat stranding. Vascular/Lymphatic: Atherosclerotic nonaneurysmal abdominal aorta. Patent portal, splenic, hepatic and renal veins. No pathologically enlarged lymph nodes in the abdomen or pelvis. Reproductive: Status post hysterectomy, with no abnormal findings at the vaginal cuff. No adnexal mass. Other: No pneumoperitoneum. Trace perihepatic ascites. Small volume ascites in the pelvic cul-de-sac. Musculoskeletal: No aggressive appearing focal osseous lesions. Mild thoracolumbar spondylosis. IMPRESSION: 1. CT findings suggest a mid to distal small bowel obstruction proximal to a long thick-walled segment of ileum in the right abdomen. This may represent a functional small-bowel obstruction due to infectious or inflammatory  ileitis or ischemia. No pneumatosis or pneumoperitoneum. 2. Trace ascites. 3. Aortic atherosclerosis.  Coronary atherosclerosis. Electronically Signed   By: Ilona Sorrel M.D.   On: 03/14/2016 09:09   Dg Abd 2 Views  Result Date: 03/15/2016 CLINICAL DATA:  Followup small bowel obstruction. EXAM: ABDOMEN - 2 VIEW COMPARISON:  CT, 03/14/2016 FINDINGS: There is no significant bowel dilation. No air-fluid levels noted on the erect view. There is no free air. Calcifications in the left upper quadrant are splenic calcified granuloma. IMPRESSION: No bowel dilation is currently seen. This suggests a resolved small bowel obstruction. No free air. Electronically Signed   By: Lajean Manes M.D.   On: 03/15/2016 08:57   Dg Abd Acute W/chest  Result Date: 03/14/2016 CLINICAL DATA:  Abdominal pain.  Nausea.  Constipation. EXAM: DG ABDOMEN ACUTE W/ 1V CHEST COMPARISON:  08/18/2015 chest radiograph. FINDINGS: Stable cardiomediastinal silhouette with normal heart size. No pneumothorax. No pleural effusion. Lungs appear clear, with no acute consolidative airspace disease and no pulmonary edema. Mild elevation of the left hemidiaphragm. Moderate gaseous distention of the stomach. Moderately dilated small bowel loops throughout the left abdomen measuring up to 4.5 cm diameter with air-fluid levels. Relative absence of small bowel gas in the right abdomen. Mild stool in the colon. No evidence of pneumatosis or pneumoperitoneum. IMPRESSION: 1. Moderately dilated small  bowel loops with air-fluid levels in the left abdomen with relative absence of small bowel gas in the right abdomen, concerning for mid to distal small bowel obstruction. Recommend correlation with CT abdomen/pelvis with IV and oral contrast. 2. No active disease in the chest. Electronically Signed   By: Ilona Sorrel M.D.   On: 03/14/2016 07:47    Time Spent in minutes  30   SINGH,PRASHANT K M.D on 03/16/2016 at 9:44 AM  Between 7am to 7pm - Pager -  [phone]  After 7pm go to [email or web address] - password [facility]  [facility] -  Office  [phone]

## 2016-03-16 NOTE — Progress Notes (Signed)
Subjective: She reports one bowel movement with a few very hard brown stool balls. He reports chronic issues with constipation and takes laxatives daily. This is been ongoing for some years.  Objective: Vital signs in last 24 hours: Temp:  [98.1 F (36.7 C)-98.4 F (36.9 C)] 98.1 F (36.7 C) (08/14 0449) Pulse Rate:  [79-86] 83 (08/14 0449) Resp:  [16] 16 (08/14 0449) BP: (119-154)/(94-98) 119/95 (08/14 0449) SpO2:  [95 %-99 %] 96 % (08/14 0449) Weight:  [90.7 kg (199 lb 15.3 oz)] 90.7 kg (199 lb 15.3 oz) (08/14 0449) Last BM Date: 03/14/16 1000 by mouth recorded IV 555 recorded No BM recorded Afebrile diastolic blood pressures up some. Creatinine is 1.1 all potassium is 4.0 Film yesterday shows bowel dilatation resolved Intake/Output from previous day: 08/13 0701 - 08/14 0700 In: [id number] [P.O.:1000; I.V.:555] Out: 1000 [Urine:1000] Intake/Output this shift: No intake/output data recorded.  General appearance: alert, cooperative and no distress Resp: clear to auscultation bilaterally GI: Soft: Few bowel sounds, some flatus and one very small BM.  Lab Results:   Recent Labs  03/14/16 1430 03/15/16 0358  WBC 8.8 6.0  HGB 15.3* 12.6  HCT 46.9* 40.2  PLT 309 227    BMET  Recent Labs  03/15/16 0358 03/15/16 0856  NA 142 143  K 3.6 4.0  CL 109 110  CO2 28 28  GLUCOSE 115* 105*  BUN 20 20  CREATININE 1.16* 1.10*  CALCIUM 8.2* 8.7*   PT/INR  Recent Labs  03/14/16 1430  LABPROT 13.2  INR 1.00     Recent Labs Lab 03/14/16 0716  AST 40  ALT 23  ALKPHOS 64  BILITOT 0.9  PROT 7.1  ALBUMIN 3.9     Lipase     Component Value Date/Time   LIPASE 19 03/14/2016 0716     Studies/Results: Dg Abd 2 Views  Result Date: 03/15/2016 CLINICAL DATA:  Followup small bowel obstruction. EXAM: ABDOMEN - 2 VIEW COMPARISON:  CT, 03/14/2016 FINDINGS: There is no significant bowel dilation. No air-fluid levels noted on the erect view. There is no free air.  Calcifications in the left upper quadrant are splenic calcified granuloma. IMPRESSION: No bowel dilation is currently seen. This suggests a resolved small bowel obstruction. No free air. Electronically Signed   By: Lajean Manes M.D.   On: 03/15/2016 08:57   Prior to Admission medications   Medication Sig Start Date End Date Taking? Authorizing Provider  Coenzyme Q10 (CO Q 10) 100 MG CAPS Take 100 mg by mouth 2 (two) times daily.   Yes Historical Provider, MD  Flaxseed, Linseed, (FLAXSEED OIL) 1000 MG CAPS Take 1,000 mg by mouth 2 (two) times daily.   Yes Historical Provider, MD  Garlic [id number] MG CAPS Take 1,000 mg by mouth 2 (two) times daily.    Yes Historical Provider, MD  lovastatin (MEVACOR) 20 MG tablet Take 20 mg by mouth daily.   Yes Historical Provider, MD  Multiple Vitamins-Minerals (MULTIVITAMIN ADULTS 50+ PO) Take 1 tablet by mouth daily.   Yes Historical Provider, MD  TURMERIC PO Take 1.25 mLs by mouth daily.   Yes Historical Provider, MD     Medications: . ampicillin-sulbactam (UNASYN) IV  3 g Intravenous Q6H  . heparin  5,000 Units Subcutaneous Q8H        Assessment/Plan SBO with possible ileal inflammation on CT scan Hypokalemia CAD FEN:  Clear liquids  =>> full liquids today ID: Day 3 Unasyn DVT:  Heparin   Plan: Continue to mobilize,  place her on full liquids started on fiber twice a day .  NO BM so far.  LOS: 2 days    Kelly Mccann 03/16/2016 [phone]

## 2016-03-16 NOTE — Discharge Instructions (Signed)
Follow with Primary MD [place] in 4-5 days   Get CBC, CMP, 2 view Chest X ray checked  by Primary MD or SNF MD in 5-7 days ( we routinely change or add medications that can affect your baseline labs and fluid status, therefore we recommend that you get the mentioned basic workup next visit with your PCP, your PCP may decide not to get them or add new tests based on their clinical decision)   Activity: As tolerated with Full fall precautions use walker/cane & assistance as needed   Disposition Home     Diet:   Soft advance as tolerated over the next 1 week.  For Heart failure patients - Check your Weight same time everyday, if you gain over 2 pounds, or you develop in leg swelling, experience more shortness of breath or chest pain, call your Primary MD immediately. Follow Cardiac Low Salt Diet and 1.5 lit/day fluid restriction.   On your next visit with your primary care physician please Get Medicines reviewed and adjusted.   Please request your Prim.MD to go over all Hospital Tests and Procedure/Radiological results at the follow up, please get all Hospital records sent to your Prim MD by signing hospital release before you go home.   If you experience worsening of your admission symptoms, develop shortness of breath, life threatening emergency, suicidal or homicidal thoughts you must seek medical attention immediately by calling 911 or calling your MD immediately  if symptoms less severe.  You Must read complete instructions/literature along with all the possible adverse reactions/side effects for all the Medicines you take and that have been prescribed to you. Take any new Medicines after you have completely understood and accpet all the possible adverse reactions/side effects.   Do not drive, operate heavy machinery, perform activities at heights, swimming or participation in water activities or provide baby sitting services if your were admitted for syncope or  siezures until you have seen by Primary MD or a Neurologist and advised to do so again.  Do not drive when taking Pain medications.    Do not take more than prescribed Pain, Sleep and Anxiety Medications  Special Instructions: If you have smoked or chewed Tobacco  in the last 2 yrs please stop smoking, stop any regular Alcohol  and or any Recreational drug use.  Wear Seat belts while driving.   Please note  You were cared for by a hospitalist during your hospital stay. If you have any questions about your discharge medications or the care you received while you were in the hospital after you are discharged, you can call the unit and asked to speak with the hospitalist on call if the hospitalist that took care of you is not available. Once you are discharged, your primary care physician will handle any further medical issues. Please note that NO REFILLS for any discharge medications will be authorized once you are discharged, as it is imperative that you return to your primary care physician (or establish a relationship with a primary care physician if you do not have one) for your aftercare needs so that they can reassess your need for medications and monitor your lab values.

## 2016-03-16 NOTE — Discharge Summary (Signed)
Kelly Mccann [id number] DOB: Nov 30, 1945 DOA: 03/14/2016  PCP: [facility]  Admit date: 03/14/2016  Discharge date: 03/17/2016  Admitted From: Home   Disposition:  Home   Recommendations for Outpatient Follow-up:   Follow up with PCP in 1-2 weeks  PCP Please obtain BMP/CBC, 2 view CXR in 1week,  (see Discharge instructions)   PCP Please follow up on the following pending results: None   Home Health: None   Equipment/Devices: None  Consultations: [facility] Discharge Condition: Stable   CODE STATUS: Full   Diet Recommendation: Soft   Chief Complaint  Patient presents with  . Abdominal Pain     Brief history of present illness from the day of admission and additional interim summary     Kelly Mccann a 70 y.o.female,History of hypertension, dyslipidemia, chronic constipation who comes to the hospital with 1 day history of generalized abdominal pain mostly in the lower quadrants with some nausea, no emesis, denies any diarrhea, no fever chills, came to the ER with this problem in the ER CT scan of the abdomen suggestive of small bowel obstruction with possible inflammation or infectious colitis. Gen. surgery was consulted and I was requested to admit the patient. Besides above review of systems all other review of systems are negative.    Hospital issues addressed    1. Small bowel obstruction. Likely functional due to infection, inflammation or ischemia. Gen. surgery was consulted, improved with bowel rest, Unasyn, And tolerating diet and having regular bowel movements, completely pain and nausea free, wants to be discharged, will be placed on Augmentin for 5 more days with outpatient follow-up with general surgery, recommend one-time outpatient colonoscopy, request PCP to arrange for the  same.  2.Hypertension and dyslipidemia. Resume home regimen.      Discharge diagnosis     Principal Problem:   SBO (small bowel obstruction) (HCC) Active Problems:   Hypercholesterolemia   Hypertension    Discharge instructions    Discharge Instructions    Discharge instructions    Complete by:  As directed   Follow with Primary MD [place] in 4-5 days   Get CBC, CMP, 2 view Chest X ray checked  by Primary MD or SNF MD in 5-7 days ( we routinely change or add medications that can affect your baseline labs and fluid status, therefore we recommend that you get the mentioned basic workup next visit with your PCP, your PCP may decide not to get them or add new tests based on their clinical decision)   Activity: As tolerated with Full fall precautions use walker/cane & assistance as needed   Disposition Home     Diet:   Soft advance as tolerated over the next 1 week.  For Heart failure patients - Check your Weight same time everyday, if you gain over 2 pounds, or you develop in leg swelling, experience more shortness of breath or chest pain, call your Primary MD immediately. Follow Cardiac Low Salt Diet and 1.5 lit/day fluid restriction.  On your next visit with your primary care physician please Get Medicines reviewed and adjusted.   Please request your Prim.MD to go over all Hospital Tests and Procedure/Radiological results at the follow up, please get all Hospital records sent to your Prim MD by signing hospital release before you go home.   If you experience worsening of your admission symptoms, develop shortness of breath, life threatening emergency, suicidal or homicidal thoughts you must seek medical attention immediately by calling 911 or calling your MD immediately  if symptoms less severe.  You Must read complete instructions/literature along with all the possible adverse reactions/side effects for all the Medicines you take and that have  been prescribed to you. Take any new Medicines after you have completely understood and accpet all the possible adverse reactions/side effects.   Do not drive, operate heavy machinery, perform activities at heights, swimming or participation in water activities or provide baby sitting services if your were admitted for syncope or siezures until you have seen by Primary MD or a Neurologist and advised to do so again.  Do not drive when taking Pain medications.    Do not take more than prescribed Pain, Sleep and Anxiety Medications  Special Instructions: If you have smoked or chewed Tobacco  in the last 2 yrs please stop smoking, stop any regular Alcohol  and or any Recreational drug use.  Wear Seat belts while driving.   Please note  You were cared for by a hospitalist during your hospital stay. If you have any questions about your discharge medications or the care you received while you were in the hospital after you are discharged, you can call the unit and asked to speak with the hospitalist on call if the hospitalist that took care of you is not available. Once you are discharged, your primary care physician will handle any further medical issues. Please note that NO REFILLS for any discharge medications will be authorized once you are discharged, as it is imperative that you return to your primary care physician (or establish a relationship with a primary care physician if you do not have one) for your aftercare needs so that they can reassess your need for medications and monitor your lab values.   Increase activity slowly    Complete by:  As directed   Increase activity slowly    Complete by:  As directed      Discharge Medications     Medication List    TAKE these medications   amoxicillin-clavulanate 875-125 MG tablet Commonly known as:  AUGMENTIN Take 1 tablet by mouth 2 (two) times daily.   Co Q 10 100 MG Caps Take 100 mg by mouth 2 (two) times daily.   Flaxseed Oil 1000  MG Caps Take 1,000 mg by mouth 2 (two) times daily.   Garlic 123XX123 MG Caps Take 1,000 mg by mouth 2 (two) times daily.   lovastatin 20 MG tablet Commonly known as:  MEVACOR Take 20 mg by mouth daily.   MULTIVITAMIN ADULTS 50+ PO Take 1 tablet by mouth daily.   TURMERIC PO Take 1.25 mLs by mouth daily.       Follow-up Information    [facility]. Schedule an appointment as soon as possible for a visit in 4 day(s).   Contact information: [place] [address] [phone]        [facility], [place]. Schedule an appointment as soon as possible for a visit in 1 week(s).  Specialty:  General Surgery Contact information: [address] [place] [phone]       Silvano Rusk, MD. Schedule an appointment as soon as possible for a visit in 2 week(s).   Specialty:  Gastroenterology Why:  colonoscopy Contact information: [address] [place] [address] [phone]           Major procedures and Radiology Reports - PLEASE review detailed and final reports thoroughly  -         Ct Abdomen Pelvis W Contrast  Result Date: 03/14/2016 CLINICAL DATA:  Mid abdominal pain.  Nausea.  Hysterectomy. EXAM: CT ABDOMEN AND PELVIS WITH CONTRAST TECHNIQUE: Multidetector CT imaging of the abdomen and pelvis was performed using the standard protocol following bolus administration of intravenous contrast. CONTRAST:  144mL ISOVUE-300 IOPAMIDOL (ISOVUE-300) INJECTION 61% COMPARISON:  Abdominal radiographs from earlier today. FINDINGS: Lower chest: No significant pulmonary nodules or acute consolidative airspace disease. Right coronary atherosclerosis. Hepatobiliary: Normal liver with no liver mass. Normal gallbladder with no radiopaque cholelithiasis. No biliary ductal dilatation. Pancreas: Normal, with no mass or duct dilation. Spleen: Normal size spleen. Calcified splenic granulomas. No  splenic mass. Adrenals/Urinary Tract: Mild diffuse adrenal thickening bilaterally with no discrete adrenal nodules, suggesting mild adrenal hyperplasia. Normal kidneys with no hydronephrosis and no renal mass. Normal bladder. Stomach/Bowel: Grossly normal stomach. There is a long segment of thick-walled ileum in the right abdomen, proximal to which the small bowel is mildly to moderately dilated and fluid-filled, including small bowel stool sign in the right abdomen. No small bowel mass or pneumatosis. Normal appendix. Relatively collapsed and normal large bowel with no large bowel wall thickening or pericolonic fat stranding. Vascular/Lymphatic: Atherosclerotic nonaneurysmal abdominal aorta. Patent portal, splenic, hepatic and renal veins. No pathologically enlarged lymph nodes in the abdomen or pelvis. Reproductive: Status post hysterectomy, with no abnormal findings at the vaginal cuff. No adnexal mass. Other: No pneumoperitoneum. Trace perihepatic ascites. Small volume ascites in the pelvic cul-de-sac. Musculoskeletal: No aggressive appearing focal osseous lesions. Mild thoracolumbar spondylosis. IMPRESSION: 1. CT findings suggest a mid to distal small bowel obstruction proximal to a long thick-walled segment of ileum in the right abdomen. This may represent a functional small-bowel obstruction due to infectious or inflammatory ileitis or ischemia. No pneumatosis or pneumoperitoneum. 2. Trace ascites. 3. Aortic atherosclerosis.  Coronary atherosclerosis. Electronically Signed   By: Ilona Sorrel M.D.   On: 03/14/2016 09:09   Dg Abd 2 Views  Result Date: 03/15/2016 CLINICAL DATA:  Followup small bowel obstruction. EXAM: ABDOMEN - 2 VIEW COMPARISON:  CT, 03/14/2016 FINDINGS: There is no significant bowel dilation. No air-fluid levels noted on the erect view. There is no free air. Calcifications in the left upper quadrant are splenic calcified granuloma. IMPRESSION: No bowel dilation is currently seen. This  suggests a resolved small bowel obstruction. No free air. Electronically Signed   By: Lajean Manes M.D.   On: 03/15/2016 08:57   Dg Abd Acute W/chest  Result Date: 03/14/2016 CLINICAL DATA:  Abdominal pain.  Nausea.  Constipation. EXAM: DG ABDOMEN ACUTE W/ 1V CHEST COMPARISON:  08/18/2015 chest radiograph. FINDINGS: Stable cardiomediastinal silhouette with normal heart size. No pneumothorax. No pleural effusion. Lungs appear clear, with no acute consolidative airspace disease and no pulmonary edema. Mild elevation of the left hemidiaphragm. Moderate gaseous distention of the stomach. Moderately dilated small bowel loops throughout the left abdomen measuring up to 4.5 cm diameter with air-fluid levels. Relative absence of small bowel gas in  the right abdomen. Mild stool in the colon. No evidence of pneumatosis or pneumoperitoneum. IMPRESSION: 1. Moderately dilated small bowel loops with air-fluid levels in the left abdomen with relative absence of small bowel gas in the right abdomen, concerning for mid to distal small bowel obstruction. Recommend correlation with CT abdomen/pelvis with IV and oral contrast. 2. No active disease in the chest. Electronically Signed   By: Ilona Sorrel M.D.   On: 03/14/2016 07:47    Micro Results    No results found for this or any previous visit (from the past 240 hour(s)).  Today   Subjective    Kelly Mccann today has no headache,no chest abdominal pain,no new weakness tingling or numbness, feels much better wants to go home today.    Objective   Blood pressure 139/88, pulse 71, temperature 98.9 F (37.2 C), temperature source Oral, resp. rate 16, height 5\' 6"  (1.676 m), weight 89.2 kg (196 lb 10.4 oz), SpO2 96 %.   Intake/Output Summary (Last 24 hours) at 03/17/16 0935 Last data filed at 03/16/16 1900  Gross per 24 hour  Intake              480 ml  Output              250 ml  Net              230 ml    Exam Awake Alert, Oriented x 3, No new F.N  deficits, Normal affect [place].AT,PERRAL Supple Neck,No JVD, No cervical lymphadenopathy appriciated.  Symmetrical Chest wall movement, Good air movement bilaterally, CTAB RRR,No Gallops,Rubs or new Murmurs, No Parasternal Heave +ve B.Sounds, Abd Soft, Non tender, No organomegaly appriciated, No rebound -guarding or rigidity. No Cyanosis, Clubbing or edema, No new Rash or bruise   Data Review   CBC w Diff:  Lab Results  Component Value Date   WBC 6.0 03/15/2016   HGB 12.6 03/15/2016   HCT 40.2 03/15/2016   PLT 227 03/15/2016   LYMPHOPCT 19 03/14/2016   MONOPCT 4 03/14/2016   EOSPCT 0 03/14/2016   BASOPCT 0 03/14/2016    CMP:  Lab Results  Component Value Date   NA 143 03/15/2016   K 4.0 03/15/2016   CL 110 03/15/2016   CO2 28 03/15/2016   BUN 20 03/15/2016   CREATININE 1.10 (H) 03/15/2016   PROT 7.1 03/14/2016   ALBUMIN 3.9 03/14/2016   BILITOT 0.9 03/14/2016   ALKPHOS 64 03/14/2016   AST 40 03/14/2016   ALT 23 03/14/2016  .   Total Time in preparing paper work, data evaluation and todays exam - 35 minutes  Thurnell Lose M.D on 03/17/2016 at 9:35 AM  [facility]   Office  [phone]

## 2016-03-16 NOTE — Progress Notes (Signed)
Nursing Note: Pt up to the bathroom and had "two small hard balls of stool"the patient encouraged to make the doctor aware on morning rounds and I will leave a note as well.wbb

## 2016-03-17 MED ORDER — AMOXICILLIN-POT CLAVULANATE 875-125 MG PO TABS: 1.0000 | ORAL_TABLET | Freq: Two times a day (BID) | ORAL | 0 refills | Status: DC

## 2016-03-17 NOTE — Progress Notes (Signed)
Pharmacy Antibiotic Note  Kelly Mccann is a 70 y.o. female presented to the ED on 8/12 with c/o abd pain.  To start unasyn for suspected intra-abdominal infection.  - 8/12 CXR: no active dz - 8/12 abd CT: suspect a functional small-bowel obstruction due to infectious or inflammatory ileitis or ischemia.  Today, 03/17/2016: SBO appears essentially resolved clinically [facility] wants 7d total tx; OK to transfer to PO abx once cleared by [facility] No fevers or leukocytosis, no further imaging  Plan:  Continue Unasyn 3g IV q6 hr for now  Recommend Augmentin 875 mg PO bid once ready for PO abx  __________________  Height: 5\' 6"  (167.6 cm) Weight: 196 lb 10.4 oz (89.2 kg) IBW/kg (Calculated) : 59.3  Temp (24hrs), Avg:98.4 F (36.9 C), Min:97.8 F (36.6 C), Max:98.9 F (37.2 C)   Recent Labs Lab 03/14/16 0639 03/14/16 0716 03/14/16 1430 03/15/16 0358 03/15/16 0856  WBC 8.6  --  8.8 6.0  --   CREATININE  --  1.06* 1.20* 1.16* 1.10*    Estimated Creatinine Clearance: 55.1 mL/min (by C-G formula based on SCr of 1.1 mg/dL).    No Known Allergies  Antimicrobials this admission: 8/12 unasyn>>   Thank you for allowing pharmacy to be a part of this patient's care.  Reuel Boom, PharmD, BCPS Pager: [phone] 03/17/2016, 10:10 AM

## 2016-03-17 NOTE — Progress Notes (Signed)
  Subjective: Wants to go home.  Tolerating full liquids well. Bowel movement was more normal. She has no discomfort at all.  Objective: Vital signs in last 24 hours: Temp:  [97.8 F (36.6 C)-98.9 F (37.2 C)] 98.9 F (37.2 C) (08/15 0543) Pulse Rate:  [70-71] 71 (08/15 0543) Resp:  [16-18] 16 (08/15 0543) BP: (127-159)/(75-91) 139/88 (08/15 0543) SpO2:  [96 %-98 %] 96 % (08/15 0543) Weight:  [89.2 kg (196 lb 10.4 oz)] 89.2 kg (196 lb 10.4 oz) (08/15 0543) Last BM Date: 03/16/16 480 PO recorded BM x 1 Afebrile, VSS No labs No radiology Intake/Output from previous day: 08/14 0701 - 08/15 0700 In: 1608 [P.O.:480; I.V.:1128] Out: 900 [Urine:900] Intake/Output this shift: No intake/output data recorded.  General appearance: alert, cooperative and no distress GI: soft, non-tender; bowel sounds normal; no masses,  no organomegaly  Lab Results:   Recent Labs  03/14/16 1430 03/15/16 0358  WBC 8.8 6.0  HGB 15.3* 12.6  HCT 46.9* 40.2  PLT 309 227    BMET  Recent Labs  03/15/16 0358 03/15/16 0856  NA 142 143  K 3.6 4.0  CL 109 110  CO2 28 28  GLUCOSE 115* 105*  BUN 20 20  CREATININE 1.16* 1.10*  CALCIUM 8.2* 8.7*   PT/INR  Recent Labs  03/14/16 1430  LABPROT 13.2  INR 1.00     Recent Labs Lab 03/14/16 0716  AST 40  ALT 23  ALKPHOS 64  BILITOT 0.9  PROT 7.1  ALBUMIN 3.9     Lipase     Component Value Date/Time   LIPASE 19 03/14/2016 0716     Studies/Results: No results found.  Medications: . ampicillin-sulbactam (UNASYN) IV  3 g Intravenous Q6H  . heparin  5,000 Units Subcutaneous Q8H  . psyllium  1 packet Oral BID    Assessment/Plan SBO with possible ileal inflammation on CT scan Hypokalemia CAD FEN:  full liquids  => soft diet ID: Day 3 Unasyn completed, starting day 4 DVT:  Heparin  Plan: I have advanced her to a soft diet. She needs to be on a bowel protocol for her chronic constipation. I would let her have at least a week  of antibiotics for the ileal inflammation described on the CT scan. I recommended she follow-up with her primary care. We'll see again as needed  LOS: 3 days    Densel Kronick 03/17/2016 [phone]

## 2016-03-19 ENCOUNTER — Encounter: Payer: Self-pay | Admitting: Family Medicine

## 2016-03-19 ENCOUNTER — Ambulatory Visit (INDEPENDENT_AMBULATORY_CARE_PROVIDER_SITE_OTHER): Payer: Medicare Other | Admitting: Family Medicine

## 2016-03-19 ENCOUNTER — Encounter: Payer: Self-pay | Admitting: Physician Assistant

## 2016-03-19 VITALS — BP 170/110 | HR 84 | Resp 18 | Ht 66.0 in | Wt 193.0 lb

## 2016-03-19 DIAGNOSIS — Z8719 Personal history of other diseases of the digestive system: Secondary | ICD-10-CM | POA: Diagnosis not present

## 2016-03-19 DIAGNOSIS — Z7189 Other specified counseling: Secondary | ICD-10-CM | POA: Diagnosis not present

## 2016-03-19 DIAGNOSIS — Z7689 Persons encountering health services in other specified circumstances: Secondary | ICD-10-CM

## 2016-03-19 DIAGNOSIS — I10 Essential (primary) hypertension: Secondary | ICD-10-CM | POA: Diagnosis not present

## 2016-03-19 DIAGNOSIS — Z09 Encounter for follow-up examination after completed treatment for conditions other than malignant neoplasm: Secondary | ICD-10-CM

## 2016-03-19 LAB — CBC WITH DIFFERENTIAL/PLATELET
BASOS ABS: 0 {cells}/uL (ref 0–200)
Basophils Relative: 0 %
EOS PCT: 5 %
Eosinophils Absolute: 215 cells/uL (ref 15–500)
HEMATOCRIT: 41.9 % (ref 35.0–45.0)
Hemoglobin: 14 g/dL (ref 11.7–15.5)
Lymphocytes Relative: 44 %
Lymphs Abs: 1892 cells/uL (ref 850–3900)
MCH: 30.7 pg (ref 27.0–33.0)
MCHC: 33.4 g/dL (ref 32.0–36.0)
MCV: 91.9 fL (ref 80.0–100.0)
MPV: 9.2 fL (ref 7.5–12.5)
Monocytes Absolute: 430 cells/uL (ref 200–950)
Monocytes Relative: 10 %
NEUTROS ABS: 1763 {cells}/uL (ref 1500–7800)
NEUTROS PCT: 41 %
Platelets: 281 10*3/uL (ref 140–400)
RBC: 4.56 MIL/uL (ref 3.80–5.10)
RDW: 14.4 % (ref 11.0–15.0)
WBC: 4.3 10*3/uL (ref 4.0–10.5)

## 2016-03-19 LAB — BASIC METABOLIC PANEL
BUN: 14 mg/dL (ref 7–25)
CALCIUM: 9.4 mg/dL (ref 8.6–10.4)
CO2: 25 mmol/L (ref 20–31)
Chloride: 104 mmol/L (ref 98–110)
Creat: 1.14 mg/dL — ABNORMAL HIGH (ref 0.50–0.99)
GLUCOSE: 81 mg/dL (ref 65–99)
Potassium: 4.4 mmol/L (ref 3.5–5.3)
Sodium: 141 mmol/L (ref 135–146)

## 2016-03-19 MED ORDER — AZILSARTAN-CHLORTHALIDONE 40-12.5 MG PO TABS
1.0000 | ORAL_TABLET | Freq: Every day | ORAL | 0 refills | Status: DC
Start: 2016-03-19 — End: 2016-03-27

## 2016-03-19 MED ORDER — AZILSARTAN-CHLORTHALIDONE 40-25 MG PO TABS: 1.0000 | ORAL_TABLET | Freq: Every day | ORAL | 0 refills | Status: DC

## 2016-03-19 NOTE — Progress Notes (Signed)
Subjective:    Patient ID: Kelly Mccann, female    DOB: 20-Apr-1946, 70 y.o.   MRN: [id number]  HPI Chief Complaint  Patient presents with  . Establish Care    new patient to establish care. Has had blood pressure issues for years, has taken herself off bp meds and herbal meds and is doing well. Also would like to follow up from recent hospital visit. Would like to be referred to see Dr.Carl Carlean Purl at [facility] for colonoscopy as she due. (first screening, never had one before)  . Flu Vaccine    patient declined flu vaccine today.   She is new to the practice and here to establish care and for a hospital follow up. Previous medical care: Dr. Antonietta Jewel in [place]. Prior to that she was going to the [facility].  Last CPE: last year.   HTN- diagnosed in 1989. Started on medication at that time. She stopped both blood pressure medications in March 2017. States she does not like taking blood pressure medication.  Started with Verapamil but states they switched her after 2 years because it was not controlling her blood pressure.  Has taken lasix along with BP medication. Did not like this.   She states she has been on several blood pressure medications in the past but stopped taking all of them because she did not like how she felt on the medication.  Stopped Norvasc-states it gave her shortness of breath.   States her BP in the hospital was in 120-150s over 88.  She was checking her BP at home after stopping her medications and she was getting readings of 160s/110 for weeks.   She was recently admitted to the hospital for 4 days for a small bowel obstruction. This resolved without intervention apparently. She is now taking a laxative daily- miralax and prune juice. States she was told her bowels were inflamed. She has not gotten her antibiotic and states they sent it to her mail order pharmacy. She is supposed to get this tomorrow per patient. She denies fever, chills,  abdominal pain, nausea, vomiting, diarrhea. States stools are getting softer.   Other providers: Dr Deatra Ina -eyes. Dr Carlean Purl - GI, is due to see him. Never had a colonoscopy.   Past medical history: HTN, high cholesterol.  Surgeries: hysterectomy.   Family history: father passed away from heart disease and HTN. Brother with HTN. Aunt with leukemia. Mother passed away at age 44 with dementia.   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   Former smoker quit 12 years ago, smoked 20 years 2 packs per week, occasional alcohol use- shot glass of tequila or rum 2-3 times per month, no drug use   Past Medical History:  Diagnosis Date  . Hypercholesterolemia   . Hypertension    Past Surgical History:  Procedure Laterality Date  . ABDOMINAL HYSTERECTOMY     Current Outpatient Prescriptions on File Prior to Visit  Medication Sig Dispense Refill  . Flaxseed, Linseed, (FLAXSEED OIL) 1000 MG CAPS Take 1,000 mg by mouth 2 (two) times daily.    . Garlic 123XX123 MG CAPS Take 1,000 mg by mouth 2 (two) times daily.     Marland Kitchen lovastatin (MEVACOR) 20 MG tablet Take 20 mg by mouth daily.    . Multiple Vitamins-Minerals (MULTIVITAMIN ADULTS 50+ PO) Take 1 tablet by mouth daily.    . TURMERIC PO Take 1.25 mLs by mouth daily.    Marland Kitchen  amoxicillin-clavulanate (AUGMENTIN) 875-125 MG tablet Take 1 tablet by mouth 2 (two) times daily. (Patient not taking: Reported on 03/19/2016) 10 tablet 0  . Coenzyme Q10 (CO Q 10) 100 MG CAPS Take 100 mg by mouth 2 (two) times daily.     No current facility-administered medications on file prior to visit.      Reviewed allergies, medications, past medical, surgical, family, and social history.    Review of Systems Pertinent positives and negatives in the history of present illness.     Objective:   Physical Exam BP (!) 170/110 (BP Location: Left Arm, Patient Position: Sitting, Cuff Size: Normal)   Pulse 84   Resp 18    Ht 5\' 6"  (1.676 m)   Wt 193 lb (87.5 kg)   BMI 31.15 kg/m   Alert and in no distress. PERRLA, EOMs intact.  Pharyngeal area is normal. Neck is supple without adenopathy or thyromegaly. Cardiac exam shows a regular sinus rhythm without murmurs or gallops. Lungs are clear to auscultation. Abdomen soft, normal bowel sounds, nontender, no rebound, guarding, referred pain. No hepatosplenomegaly. No CVAT. Extremities without edema, pulses normal. CNII-IX intact. Gait normal.        Assessment & Plan:  Essential hypertension  Encounter to establish care  Hospital discharge follow-up  History of small bowel obstruction  Discussed long-term complications of uncontrolled hypertension such as kidney disease, heart disease, stroke. Plan to start her on low-dose Edarbyclor today and have her take this for the next 7 days. She will check her blood pressure daily at home and if her readings are not less than 140/80 she will start taking the higher dose on day 8. Sample given #7 for low dose and high-dose #7 as well. She will bring in her blood pressure cuff and blood pressure readings in 2 weeks and we will determine if medication is effective. She will call if she is having any issues or concerns with medication. Scheduled an appointment with GI for her next Friday morning. She will advance diet as tolerated and continue taking MiraLAX for now. She has never had a colonoscopy and will need this in the near future. She will let me know if the antibiotics do not arrive via mail. Reviewed lab results and radiology results from her hospital stay and x-ray did show that her SBO had resolved prior to discharge. Will call with lab results. Will await medical records from previous PCP.

## 2016-03-19 NOTE — Patient Instructions (Addendum)
Take the lower dose Edarbyclor 40mg /12.5mg  for the first 7 days. Keep an eye on your blood pressure by checking this at least once daily. Switch to the higher dose 40mg /25mg  on day 8 if your blood pressure is not <140/80. If your blood pressure is <140/80 on the low dose then call me on day 6 or 7.  Return in 2 weeks and bring your blood pressure readings from home. Also bring in your blood pressure cuff. We will call you with lab results.  You have an appointment on August 25th at [facility] [place] with the PA. You can then see Dr. Carlean Purl going forward if you choose.    DASH Eating Plan DASH stands for "Dietary Approaches to Stop Hypertension." The DASH eating plan is a healthy eating plan that has been shown to reduce high blood pressure (hypertension). Additional health benefits may include reducing the risk of type 2 diabetes mellitus, heart disease, and stroke. The DASH eating plan may also help with weight loss. WHAT DO I NEED TO KNOW ABOUT THE DASH EATING PLAN? For the DASH eating plan, you will follow these general guidelines:  Choose foods with a percent daily value for sodium of less than 5% (as listed on the food label).  Use salt-free seasonings or herbs instead of table salt or sea salt.  Check with your health care provider or pharmacist before using salt substitutes.  Eat lower-sodium products, often labeled as "lower sodium" or "no salt added."  Eat fresh foods.  Eat more vegetables, fruits, and low-fat dairy products.  Choose whole grains. Look for the word "whole" as the first word in the ingredient list.  Choose fish and skinless chicken or [place] more often than red meat. Limit fish, poultry, and meat to 6 oz (170 g) each day.  Limit sweets, desserts, sugars, and sugary drinks.  Choose heart-healthy fats.  Limit cheese to 1 oz (28 g) per day.  Eat more home-cooked food and less restaurant, buffet, and fast food.  Limit fried foods.  Cook foods using methods  other than frying.  Limit canned vegetables. If you do use them, rinse them well to decrease the sodium.  When eating at a restaurant, ask that your food be prepared with less salt, or no salt if possible. WHAT FOODS CAN I EAT? Seek help from a dietitian for individual calorie needs. Grains Whole grain or whole wheat bread. Brown rice. Whole grain or whole wheat pasta. Quinoa, bulgur, and whole grain cereals. Low-sodium cereals. Corn or whole wheat flour tortillas. Whole grain cornbread. Whole grain crackers. Low-sodium crackers. Vegetables Fresh or frozen vegetables (raw, steamed, roasted, or grilled). Low-sodium or reduced-sodium tomato and vegetable juices. Low-sodium or reduced-sodium tomato sauce and paste. Low-sodium or reduced-sodium canned vegetables.  Fruits All fresh, canned (in natural juice), or frozen fruits. Meat and Other Protein Products Ground beef (85% or leaner), grass-fed beef, or beef trimmed of fat. Skinless chicken or [place]. Ground chicken or [place]. Pork trimmed of fat. All fish and seafood. Eggs. Dried beans, peas, or lentils. Unsalted nuts and seeds. Unsalted canned beans. Dairy Low-fat dairy products, such as skim or 1% milk, 2% or reduced-fat cheeses, low-fat ricotta or cottage cheese, or plain low-fat yogurt. Low-sodium or reduced-sodium cheeses. Fats and Oils Tub margarines without trans fats. Light or reduced-fat mayonnaise and salad dressings (reduced sodium). Avocado. Safflower, olive, or canola oils. Natural peanut or almond butter. Other Unsalted popcorn and pretzels. The items listed above may not be a complete list of  recommended foods or beverages. Contact your dietitian for more options. WHAT FOODS ARE NOT RECOMMENDED? Grains White bread. White pasta. White rice. Refined cornbread. Bagels and croissants. Crackers that contain trans fat. Vegetables Creamed or fried vegetables. Vegetables in a cheese sauce. Regular canned vegetables. Regular canned  tomato sauce and paste. Regular tomato and vegetable juices. Fruits Dried fruits. Canned fruit in light or heavy syrup. Fruit juice. Meat and Other Protein Products Fatty cuts of meat. Ribs, chicken wings, bacon, sausage, bologna, salami, chitterlings, fatback, hot dogs, bratwurst, and packaged luncheon meats. Salted nuts and seeds. Canned beans with salt. Dairy Whole or 2% milk, cream, half-and-half, and cream cheese. Whole-fat or sweetened yogurt. Full-fat cheeses or blue cheese. Nondairy creamers and whipped toppings. Processed cheese, cheese spreads, or cheese curds. Condiments Onion and garlic salt, seasoned salt, table salt, and sea salt. Canned and packaged gravies. Worcestershire sauce. Tartar sauce. Barbecue sauce. Teriyaki sauce. Soy sauce, including reduced sodium. Steak sauce. Fish sauce. Oyster sauce. Cocktail sauce. Horseradish. Ketchup and mustard. Meat flavorings and tenderizers. Bouillon cubes. Hot sauce. Tabasco sauce. Marinades. Taco seasonings. Relishes. Fats and Oils Butter, stick margarine, lard, shortening, ghee, and bacon fat. Coconut, palm kernel, or palm oils. Regular salad dressings. Other Pickles and olives. Salted popcorn and pretzels. The items listed above may not be a complete list of foods and beverages to avoid. Contact your dietitian for more information. WHERE CAN I FIND MORE INFORMATION? National Heart, Lung, and Blood Institute: travelstabloid.com   This information is not intended to replace advice given to you by your health care provider. Make sure you discuss any questions you have with your health care provider.   Document Released: 07/09/2011 Document Revised: 08/10/2014 Document Reviewed: 05/24/2013 Elsevier Interactive Patient Education [employer].

## 2016-03-25 ENCOUNTER — Telehealth: Payer: Self-pay | Admitting: Family Medicine

## 2016-03-25 NOTE — Telephone Encounter (Signed)
Rcvd office notes, labs from [facility]

## 2016-03-27 ENCOUNTER — Encounter: Payer: Self-pay | Admitting: Physician Assistant

## 2016-03-27 ENCOUNTER — Ambulatory Visit (INDEPENDENT_AMBULATORY_CARE_PROVIDER_SITE_OTHER): Payer: Medicare Other | Admitting: Physician Assistant

## 2016-03-27 VITALS — BP 136/90 | HR 72 | Ht 65.0 in | Wt 193.0 lb

## 2016-03-27 DIAGNOSIS — Z8719 Personal history of other diseases of the digestive system: Secondary | ICD-10-CM

## 2016-03-27 DIAGNOSIS — Z1212 Encounter for screening for malignant neoplasm of rectum: Secondary | ICD-10-CM

## 2016-03-27 DIAGNOSIS — R935 Abnormal findings on diagnostic imaging of other abdominal regions, including retroperitoneum: Secondary | ICD-10-CM | POA: Diagnosis not present

## 2016-03-27 DIAGNOSIS — Z1211 Encounter for screening for malignant neoplasm of colon: Secondary | ICD-10-CM

## 2016-03-27 NOTE — Patient Instructions (Signed)

## 2016-03-27 NOTE — Progress Notes (Signed)
Chief Complaint: History of recent admission for small bowel obstruction and abnormal CT  HPI:  [name] is a 70 year old African-American female, with a past medical history significant for arthritis, asthma, hypercholesterolemia and hypertension, who was referred to me by Girtha Rm, NP for a complaint of hospital follow-up after episode of small bowel obstruction abnormal CT.  Patient was recently admitted to the hospital from 03/14/16 through 03/17/16 for a small bowel obstruction. During that time general surgery was consulted. It was thought that this was likely functional due to infection, inflammation or ischemia. Patient improved with bowel rest, Unasyn and was tolerating a diet and having regular bowel movements, she was completely pain and nausea free at time of discharge. She was placed on Augmentin for 5 more days and instructed to follow-up with [place] for an outpatient colonoscopy. The patient did have a CT on 03/14/16 at time of admission which showed findings suggestive of a mid to distal small bowel obstruction proximal to a long thick walled segment of ileum in the right abdomen. This was thought to represent a functional small bowel obstruction due to infectious or inflammatory ileitis or ischemia. No pneumatosis or pneumoperitoneum. Trace ascites. Aortic atherosclerosis. Coronary atherosclerosis. Patient had a follow up xray on 03/15/16 which showed no further bowel dilation, which suggested that her small bowel obstruction had resolved. Labs during that admission, last on 03/19/16 showed a BMP with an elevated creatinine of 1.14 and a normal CBC as well as differential.  Today, the patient tells me that she has done well since being discharged from the hospital. She did have a problem with constipation prior to admission, but since being discharged she has started on daily MiraLAX, 1 teaspoon a day and this does keep her with regular soft bowel movements. She has had no further  abdominal pain or gas. Overall she feels completely well. The patient does express that she met Dr. Carlean Purl while in the hospital and asked if he can do her procedure.  Patient denies fever, chills, blood in her stool, melena, change in bowel habits, weight loss, fatigue, anorexia, nausea, vomiting, heartburn and reflux or abdominal pain.  Past Medical History:  Diagnosis Date  . Arthritis   . Asthma   . Hypercholesterolemia   . Hypertension     Past Surgical History:  Procedure Laterality Date  . PARTIAL HYSTERECTOMY  1990    Current Outpatient Prescriptions  Medication Sig Dispense Refill  . amoxicillin-clavulanate (AUGMENTIN) 875-125 MG tablet Take 1 tablet by mouth 2 (two) times daily.    . Azilsartan-Chlorthalidone (EDARBYCLOR) 40-25 MG TABS Take 1 tablet by mouth daily. 7 tablet 0   No current facility-administered medications for this visit.     Allergies as of 03/27/2016  . (No Known Allergies)    Family History  Problem Relation Age of Onset  . Alzheimer's disease Mother   . Heart disease Father   . Leukemia Maternal Aunt     Social History   Social History  . Marital status: Widowed    Spouse name: N/A  . Number of children: 3  . Years of education: N/A   Occupational History  . retired    Social History Main Topics  . Smoking status: Former Smoker    Types: Cigarettes    Quit date: 08/18/1999  . Smokeless tobacco: Never Used  . Alcohol use Yes     Comment: ocassional  . Drug use: No  . Sexual activity: Not on file   Other Topics  Concern  . Not on file   Social History Narrative  . No narrative on file    Review of Systems:    Constitutional: No weight loss, fever, chills, weakness or fatigue HEENT: Eyes: No Change in vision               Ears, Nose, Throat:  No change in hearing or congestion Skin: No rash or itching Cardiovascular: No chest pain, chest pressure or palpitations    Respiratory: No SOB or cough Gastrointestinal: See HPI  and otherwise negative Genitourinary: No dysuria or change in urinary frequency Neurological: No headache, dizziness or syncope Musculoskeletal: No new muscle or back pain Hematologic: No anemia, bleeding or bruising Psychiatric: No history of depression or anxiety   Physical Exam:  Vital signs: BP 136/90 (BP Location: Left Arm, Patient Position: Sitting, Cuff Size: Normal)   Pulse 72   Ht 5' 5" (1.651 m) Comment: height measured without shoes  Wt 193 lb (87.5 kg)   BMI 32.12 kg/m   General:   Pleasant Overweight African-American female appears to be in NAD, Well developed, Well nourished, alert and cooperative Head:  Normocephalic and atraumatic. Eyes:   PEERL, EOMI. No icterus. Conjunctiva pink. Ears:  Normal auditory acuity. Neck:  Supple Throat: Oral cavity and pharynx without inflammation, swelling or lesion. Teeth in good condition. Lungs: Respirations even and unlabored. Lungs clear to auscultation bilaterally.   No wheezes, crackles, or rhonchi.  Heart: Normal S1, S2. No MRG. Regular rate and rhythm. No peripheral edema, cyanosis or pallor.  Abdomen:  Soft, nondistended, nontender. No rebound or guarding. Normal bowel sounds. No appreciable masses or hepatomegaly. Rectal:  Not performed.  Msk:  Symmetrical without gross deformities. Peripheral pulses intact.  Extremities:  Without edema, no deformity or joint abnormality. Normal ROM. Neurologic:  Alert and  oriented x4;  grossly normal neurologically.   Skin:   Dry and intact without significant lesions or rashes. Psychiatric: Oriented to person, place and time. Demonstrates good judgement and reason without abnormal affect or behaviors.  RELEVANT LABS AND IMAGING: CBC    Component Value Date/Time   WBC 4.3 03/19/2016 0001   RBC 4.56 03/19/2016 0001   HGB 14.0 03/19/2016 0001   HCT 41.9 03/19/2016 0001   PLT 281 03/19/2016 0001   MCV 91.9 03/19/2016 0001   MCH 30.7 03/19/2016 0001   MCHC 33.4 03/19/2016 0001   RDW  14.4 03/19/2016 0001   LYMPHSABS 1,892 03/19/2016 0001   MONOABS 430 03/19/2016 0001   EOSABS 215 03/19/2016 0001   BASOSABS 0 03/19/2016 0001    CMP     Component Value Date/Time   NA 141 03/19/2016 0001   K 4.4 03/19/2016 0001   CL 104 03/19/2016 0001   CO2 25 03/19/2016 0001   GLUCOSE 81 03/19/2016 0001   BUN 14 03/19/2016 0001   CREATININE 1.14 (H) 03/19/2016 0001   CALCIUM 9.4 03/19/2016 0001   PROT 7.1 03/14/2016 0716   ALBUMIN 3.9 03/14/2016 0716   AST 40 03/14/2016 0716   ALT 23 03/14/2016 0716   ALKPHOS 64 03/14/2016 0716   BILITOT 0.9 03/14/2016 0716   GFRNONAA 50 (L) 03/15/2016 0856   GFRAA 58 (L) 03/15/2016 0856    Assessment: 1. History of small bowel obstruction: Recent hospital admission earlier this month, resolved with antibiotics and bowel rest over a period of 2-3 days. Patient was discharged and has done well since that time on daily MiraLAX. 2. Abnormal CT of the abdomen and pelvis: At  time of admission CT showed thickening in the ileum. It was recommended the patient follow with [place] for a colonoscopy. 3. Screening for colorectal malignancy: Patient is 70 years old and has never had a screening colonoscopy. She is due at this time. No family history of colon cancer or polyps that she is aware of.  Plan: 1. Recommend patient schedule for a screening colonoscopy. Discussed risks, benefits, limitations and alternatives this procedure and the patient agrees to proceed. She does request Dr. Carlean Purl as she met him during her hospital stay recently. 2. Patient should continue MiraLAX. We did discuss that she can titrate this to having soft regular bowel movements. Currently she is using 1 teaspoon per day which seems to be working for her. 3. Encouraged adequate water intake, at least 6-8 8 ounce glasses of water a day. 4. Patient to follow in clinic per Dr. Cheryle Horsfall recommendations after time procedure.  Ellouise Newer, PA-C [facility] 03/27/2016,  10:14 AM  Cc: Girtha Rm, NP

## 2016-03-30 ENCOUNTER — Telehealth: Payer: Self-pay | Admitting: Family Medicine

## 2016-03-30 DIAGNOSIS — I10 Essential (primary) hypertension: Secondary | ICD-10-CM

## 2016-03-30 MED ORDER — AZILSARTAN-CHLORTHALIDONE 40-25 MG PO TABS: 1.0000 | ORAL_TABLET | Freq: Every day | ORAL | 0 refills | Status: DC

## 2016-03-30 NOTE — Telephone Encounter (Signed)
Request refill on Edarbyclor 40-25 mg to [facility]. Pt rescheduled her blood pressure follow up appt to 04/07/16 because pt says she is going to hosp for colon screen on 04/03/16 and needed to rescheduled her appt with our office however she will be out of bp meds in a couple of days. Pt will need med sent to [facility]

## 2016-03-30 NOTE — Telephone Encounter (Signed)
Sent med into pharmacy 

## 2016-04-02 ENCOUNTER — Ambulatory Visit: Payer: Medicare Other | Admitting: Family Medicine

## 2016-04-03 ENCOUNTER — Ambulatory Visit (AMBULATORY_SURGERY_CENTER): Payer: Medicare Other | Admitting: Internal Medicine

## 2016-04-03 ENCOUNTER — Encounter: Payer: Self-pay | Admitting: Internal Medicine

## 2016-04-03 VITALS — BP 112/77 | HR 83 | Temp 98.2°F | Resp 14 | Ht 65.0 in | Wt 193.0 lb

## 2016-04-03 DIAGNOSIS — D124 Benign neoplasm of descending colon: Secondary | ICD-10-CM

## 2016-04-03 DIAGNOSIS — Z1212 Encounter for screening for malignant neoplasm of rectum: Secondary | ICD-10-CM | POA: Diagnosis not present

## 2016-04-03 DIAGNOSIS — D125 Benign neoplasm of sigmoid colon: Secondary | ICD-10-CM | POA: Diagnosis not present

## 2016-04-03 DIAGNOSIS — Z1211 Encounter for screening for malignant neoplasm of colon: Secondary | ICD-10-CM | POA: Diagnosis present

## 2016-04-03 DIAGNOSIS — K5669 Other intestinal obstruction: Secondary | ICD-10-CM | POA: Diagnosis not present

## 2016-04-03 DIAGNOSIS — D123 Benign neoplasm of transverse colon: Secondary | ICD-10-CM

## 2016-04-03 MED ORDER — SODIUM CHLORIDE 0.9 % IV SOLN: 500.0000 mL | INTRAVENOUS | Status: DC

## 2016-04-03 NOTE — Op Note (Signed)
[facility] Patient Name: Kelly Mccann Procedure Date: 04/03/2016 8:30 AM MRN: [id number] Endoscopist: Gatha Mayer , MD Age: 70 Referring MD:  Date of Birth: 06-25-1946 Gender: Female Account #: [id number] Procedure:                Colonoscopy Indications:              Screening for colorectal malignant neoplasm, This                            is the patient's first colonoscopy Medicines:                Propofol per Anesthesia, Monitored Anesthesia Care Procedure:                Pre-Anesthesia Assessment:                           - Prior to the procedure, a History and Physical                            was performed, and patient medications and                            allergies were reviewed. The patient's tolerance of                            previous anesthesia was also reviewed. The risks                            and benefits of the procedure and the sedation                            options and risks were discussed with the patient.                            All questions were answered, and informed consent                            was obtained. Prior Anticoagulants: The patient has                            taken no previous anticoagulant or antiplatelet                            agents. ASA Grade Assessment: II - A patient with                            mild systemic disease. After reviewing the risks                            and benefits, the patient was deemed in                            satisfactory condition to undergo the procedure.  After obtaining informed consent, the colonoscope                            was passed under direct vision. Throughout the                            procedure, the patient's blood pressure, pulse, and                            oxygen saturations were monitored continuously. The                            Model CF-HQ190L [id number]) scope was introduced   through the anus and advanced to the the terminal                            ileum, with identification of the appendiceal                            orifice and IC valve. The colonoscopy was performed                            without difficulty. The terminal ileum, ileocecal                            valve, appendiceal orifice, and rectum were                            photographed. The quality of the bowel preparation                            was excellent. Scope In: 8:40:54 AM Scope Out: 9:01:02 AM Scope Withdrawal Time: 0 hours 15 minutes 16 seconds  Total Procedure Duration: 0 hours 20 minutes 8 seconds  Findings:                 The perianal and digital rectal examinations were                            normal.                           Three pedunculated polyps were found in the sigmoid                            colon, descending colon and transverse colon. The                            polyps were 9 to 15 mm in size. These polyps were                            removed with a hot snare. Resection and retrieval                            were complete. Verification of  patient                            identification for the specimen was done. Estimated                            blood loss was minimal.                           A 4 mm polyp was found in the transverse colon. The                            polyp was sessile. The polyp was removed with a                            cold snare. Resection and retrieval were complete.                            Verification of patient identification for the                            specimen was done. Estimated blood loss was minimal.                           The terminal ileum appeared normal.                           The exam was otherwise without abnormality on                            direct and retroflexion views. Complications:            No immediate complications. Estimated Blood Loss:     Estimated blood loss was  minimal. Impression:               - Three 9 to 15 mm polyps in the sigmoid colon, in                            the descending colon and in the transverse colon,                            removed with a hot snare. Resected and retrieved.                           - One 4 mm polyp in the transverse colon, removed                            with a cold snare. Resected and retrieved.                           - The examined portion of the ileum was normal.                           - The examination was otherwise normal on direct  and retroflexion views. 4 POLYPS TOTAL Recommendation:           - Patient has a contact number available for                            emergencies. The signs and symptoms of potential                            delayed complications were discussed with the                            patient. Return to normal activities tomorrow.                            Written discharge instructions were provided to the                            patient.                           - Resume previous diet.                           - Continue present medications.                           - No aspirin, ibuprofen, naproxen, or other                            non-steroidal anti-inflammatory drugs for 2 weeks                            after polyp removal.                           - Repeat colonoscopy is recommended. The                            colonoscopy date will be determined after pathology                            results from today's exam become available for                            review. Gatha Mayer, MD 04/03/2016 9:13:13 AM This report has been signed electronically.

## 2016-04-03 NOTE — Progress Notes (Signed)
To recovery awake alert VSS report to [employer]

## 2016-04-03 NOTE — Patient Instructions (Addendum)
   I found and removed 4 polyps. I will let you know pathology results and when to have another routine colonoscopy by mail. Small intestine looks normal.  I appreciate the opportunity to care for you. Gatha Mayer, MD, FACG  YOU HAD AN ENDOSCOPIC PROCEDURE TODAY AT [facility]:   Refer to the procedure report that was given to you for any specific questions about what was found during the examination.  If the procedure report does not answer your questions, please call your gastroenterologist to clarify.  If you requested that your care partner not be given the details of your procedure findings, then the procedure report has been included in a sealed envelope for you to review at your convenience later.  YOU SHOULD EXPECT: Some feelings of bloating in the abdomen. Passage of more gas than usual.  Walking can help get rid of the air that was put into your GI tract during the procedure and reduce the bloating. If you had a lower endoscopy (such as a colonoscopy or flexible sigmoidoscopy) you may notice spotting of blood in your stool or on the toilet paper. If you underwent a bowel prep for your procedure, you may not have a normal bowel movement for a few days.  Please Note:  You might notice some irritation and congestion in your nose or some drainage.  This is from the oxygen used during your procedure.  There is no need for concern and it should clear up in a day or so.  SYMPTOMS TO REPORT IMMEDIATELY:   Following lower endoscopy (colonoscopy or flexible sigmoidoscopy):  Excessive amounts of blood in the stool  Significant tenderness or worsening of abdominal pains  Swelling of the abdomen that is new, acute  Fever of 100F or higher   For urgent or emergent issues, a gastroenterologist can be reached at any hour by calling [phone].   DIET:  We do recommend a small meal at first, but then you may proceed to your regular diet.  Drink plenty of fluids but  you should avoid alcoholic beverages for 24 hours.  ACTIVITY:  You should plan to take it easy for the rest of today and you should NOT DRIVE or use heavy machinery until tomorrow (because of the sedation medicines used during the test).    FOLLOW UP: Our staff will call the number listed on your records the next business day following your procedure to check on you and address any questions or concerns that you may have regarding the information given to you following your procedure. If we do not reach you, we will leave a message.  However, if you are feeling well and you are not experiencing any problems, there is no need to return our call.  We will assume that you have returned to your regular daily activities without incident.  If any biopsies were taken you will be contacted by phone or by letter within the next 1-3 weeks.  Please call us at [phone] if you have not heard about the biopsies in 3 weeks.    SIGNATURES/CONFIDENTIALITY: You and/or your care partner have signed paperwork which will be entered into your electronic medical record.  These signatures attest to the fact that that the information above on your After Visit Summary has been reviewed and is understood.  Full responsibility of the confidentiality of this discharge information lies with you and/or your care-partner.

## 2016-04-03 NOTE — Progress Notes (Signed)
Called to room to assist during endoscopic procedure.  Patient ID and intended procedure confirmed with present staff. Received instructions for my participation in the procedure from the performing physician.  

## 2016-04-05 NOTE — Progress Notes (Signed)
Screening colonoscopy showed several polyps, NL terminal ileum. Sxs that resulted in hospitalization are gone.  Gatha Mayer, MD, [facility]

## 2016-04-07 ENCOUNTER — Telehealth: Payer: Self-pay | Admitting: *Deleted

## 2016-04-07 ENCOUNTER — Encounter: Payer: Self-pay | Admitting: Family Medicine

## 2016-04-07 ENCOUNTER — Ambulatory Visit (INDEPENDENT_AMBULATORY_CARE_PROVIDER_SITE_OTHER): Payer: Medicare Other | Admitting: Family Medicine

## 2016-04-07 VITALS — BP 168/98 | HR 76 | Wt 192.6 lb

## 2016-04-07 DIAGNOSIS — I10 Essential (primary) hypertension: Secondary | ICD-10-CM | POA: Diagnosis not present

## 2016-04-07 DIAGNOSIS — E7800 Pure hypercholesterolemia, unspecified: Secondary | ICD-10-CM

## 2016-04-07 DIAGNOSIS — N189 Chronic kidney disease, unspecified: Secondary | ICD-10-CM | POA: Diagnosis not present

## 2016-04-07 MED ORDER — AZILSARTAN-CHLORTHALIDONE 40-25 MG PO TABS: 1.0000 | ORAL_TABLET | Freq: Every day | ORAL | 0 refills | Status: DC

## 2016-04-07 NOTE — Telephone Encounter (Signed)
  Follow up Call-  Call back number 04/03/2016  Post procedure Call Back phone  # [phone]  Permission to leave phone message Yes  Some recent data might be hidden     Patient questions:  Message left to call us if necessary.

## 2016-04-07 NOTE — Patient Instructions (Addendum)
Return for a nurse visit in 1 week after taking Edarbyclor to see what your blood pressure is. Also report to the nurse what your readings at home have been. I would like to see daily readings <140/80 in order to have good control and protect your kidneys.  Follow up in 2-3 months for repeat labs and follow up on kidney function and blood pressure.   DASH Eating Plan DASH stands for "Dietary Approaches to Stop Hypertension." The DASH eating plan is a healthy eating plan that has been shown to reduce high blood pressure (hypertension). Additional health benefits may include reducing the risk of type 2 diabetes mellitus, heart disease, and stroke. The DASH eating plan may also help with weight loss. WHAT DO I NEED TO KNOW ABOUT THE DASH EATING PLAN? For the DASH eating plan, you will follow these general guidelines:  Choose foods with a percent daily value for sodium of less than 5% (as listed on the food label).  Use salt-free seasonings or herbs instead of table salt or sea salt.  Check with your health care provider or pharmacist before using salt substitutes.  Eat lower-sodium products, often labeled as "lower sodium" or "no salt added."  Eat fresh foods.  Eat more vegetables, fruits, and low-fat dairy products.  Choose whole grains. Look for the word "whole" as the first word in the ingredient list.  Choose fish and skinless chicken or [place] more often than red meat. Limit fish, poultry, and meat to 6 oz (170 g) each day.  Limit sweets, desserts, sugars, and sugary drinks.  Choose heart-healthy fats.  Limit cheese to 1 oz (28 g) per day.  Eat more home-cooked food and less restaurant, buffet, and fast food.  Limit fried foods.  Cook foods using methods other than frying.  Limit canned vegetables. If you do use them, rinse them well to decrease the sodium.  When eating at a restaurant, ask that your food be prepared with less salt, or no salt if possible. WHAT FOODS CAN I  EAT? Seek help from a dietitian for individual calorie needs. Grains Whole grain or whole wheat bread. Brown rice. Whole grain or whole wheat pasta. Quinoa, bulgur, and whole grain cereals. Low-sodium cereals. Corn or whole wheat flour tortillas. Whole grain cornbread. Whole grain crackers. Low-sodium crackers. Vegetables Fresh or frozen vegetables (raw, steamed, roasted, or grilled). Low-sodium or reduced-sodium tomato and vegetable juices. Low-sodium or reduced-sodium tomato sauce and paste. Low-sodium or reduced-sodium canned vegetables.  Fruits All fresh, canned (in natural juice), or frozen fruits. Meat and Other Protein Products Ground beef (85% or leaner), grass-fed beef, or beef trimmed of fat. Skinless chicken or [place]. Ground chicken or [place]. Pork trimmed of fat. All fish and seafood. Eggs. Dried beans, peas, or lentils. Unsalted nuts and seeds. Unsalted canned beans. Dairy Low-fat dairy products, such as skim or 1% milk, 2% or reduced-fat cheeses, low-fat ricotta or cottage cheese, or plain low-fat yogurt. Low-sodium or reduced-sodium cheeses. Fats and Oils Tub margarines without trans fats. Light or reduced-fat mayonnaise and salad dressings (reduced sodium). Avocado. Safflower, olive, or canola oils. Natural peanut or almond butter. Other Unsalted popcorn and pretzels. The items listed above may not be a complete list of recommended foods or beverages. Contact your dietitian for more options. WHAT FOODS ARE NOT RECOMMENDED? Grains White bread. White pasta. White rice. Refined cornbread. Bagels and croissants. Crackers that contain trans fat. Vegetables Creamed or fried vegetables. Vegetables in a cheese sauce. Regular canned vegetables. Regular canned tomato  sauce and paste. Regular tomato and vegetable juices. Fruits Dried fruits. Canned fruit in light or heavy syrup. Fruit juice. Meat and Other Protein Products Fatty cuts of meat. Ribs, chicken wings, bacon, sausage,  bologna, salami, chitterlings, fatback, hot dogs, bratwurst, and packaged luncheon meats. Salted nuts and seeds. Canned beans with salt. Dairy Whole or 2% milk, cream, half-and-half, and cream cheese. Whole-fat or sweetened yogurt. Full-fat cheeses or blue cheese. Nondairy creamers and whipped toppings. Processed cheese, cheese spreads, or cheese curds. Condiments Onion and garlic salt, seasoned salt, table salt, and sea salt. Canned and packaged gravies. Worcestershire sauce. Tartar sauce. Barbecue sauce. Teriyaki sauce. Soy sauce, including reduced sodium. Steak sauce. Fish sauce. Oyster sauce. Cocktail sauce. Horseradish. Ketchup and mustard. Meat flavorings and tenderizers. Bouillon cubes. Hot sauce. Tabasco sauce. Marinades. Taco seasonings. Relishes. Fats and Oils Butter, stick margarine, lard, shortening, ghee, and bacon fat. Coconut, palm kernel, or palm oils. Regular salad dressings. Other Pickles and olives. Salted popcorn and pretzels. The items listed above may not be a complete list of foods and beverages to avoid. Contact your dietitian for more information. WHERE CAN I FIND MORE INFORMATION? National Heart, Lung, and Blood Institute: travelstabloid.com   This information is not intended to replace advice given to you by your health care provider. Make sure you discuss any questions you have with your health care provider.   Document Released: 07/09/2011 Document Revised: 08/10/2014 Document Reviewed: 05/24/2013 Elsevier Interactive Patient Education [employer].

## 2016-04-07 NOTE — Progress Notes (Signed)
   Subjective:    Patient ID: Kelly Mccann, female    DOB: [birth year], 70 y.o.   MRN: [id number]  HPI Chief Complaint  Patient presents with  . Other    2 week follow-up on HTN.    She is here to follow up on HTN. She has been out of HTN medication for the past 4 days. She bought a blood pressure cuff and brought this in today.  Her readings at home have been much improved on Edarbyclor. Readings have been in 120s and 130s/ 70s-80s consistently prior to her running out of the medication.   States she took a one week course of the lower dose of Edarbyclor then increased this to the higher dose.   She has history of issues with Norvasc and other blood pressure medications but states she is doing well with Edarbyclor and no side effects.  Diet - states she has cut back on salt.  Exercise- has not been doing anything  Denies smoking, drugs and occasional alcohol use.   Denies fever, chills, headache, dizziness, blurred or double vision, chest pain, palpitations, DOE, GI or GU symptoms.   Had colonoscopy last week.   Reviewed allergies, medications, past medical, and social history.    Review of Systems Pertinent positives and negatives in the history of present illness.     Objective:   Physical Exam BP (!) 168/98   Pulse 76   Wt 192 lb 9.6 oz (87.4 kg)   BMI 32.05 kg/m  Alert and in no distress. Cardiac exam shows a regular rhythm without murmurs or gallops. Lungs are clear to auscultation. Extremities without edema.       Assessment & Plan:  Essential hypertension - Plan: Azilsartan-Chlorthalidone (EDARBYCLOR) 40-25 MG TABS  CKD (chronic kidney disease), unspecified stage  Hypercholesterolemia - Plan: Lipid panel  Sample Edarbyclor #7 given. She is awaiting medication from mail order. Advised against running out of her medication and to contact us if she starts to run low.  Advised that she does have CKD and recommend that we keep her blood pressure under better  control in order to keep this from progressing.  Discussed DASH diet and exercise.   Need to check lipids when she returns next week for a nurse visit and blood pressure check. Order placed in computer and Gabriel Cirri called to let patient know that she needs to be fasting for this.  Follow up in 3 months or pending labs.

## 2016-04-09 ENCOUNTER — Encounter: Payer: Self-pay | Admitting: Internal Medicine

## 2016-04-09 DIAGNOSIS — Z86010 Personal history of colonic polyps: Secondary | ICD-10-CM

## 2016-04-09 DIAGNOSIS — Z860101 Personal history of adenomatous and serrated colon polyps: Secondary | ICD-10-CM

## 2016-04-09 HISTORY — DX: Personal history of adenomatous and serrated colon polyps: Z86.0101

## 2016-04-09 HISTORY — DX: Personal history of colonic polyps: Z86.010

## 2016-04-09 NOTE — Progress Notes (Signed)
4 adenomas max 15 mm Recall 2020

## 2016-05-18 ENCOUNTER — Other Ambulatory Visit: Payer: Self-pay | Admitting: Family Medicine

## 2016-05-18 DIAGNOSIS — I10 Essential (primary) hypertension: Secondary | ICD-10-CM

## 2016-06-05 ENCOUNTER — Encounter: Payer: Self-pay | Admitting: Family Medicine

## 2016-07-03 ENCOUNTER — Encounter: Payer: Self-pay | Admitting: Family Medicine

## 2016-07-03 ENCOUNTER — Ambulatory Visit (INDEPENDENT_AMBULATORY_CARE_PROVIDER_SITE_OTHER): Payer: Medicare Other | Admitting: Family Medicine

## 2016-07-03 VITALS — BP 144/88 | HR 76

## 2016-07-03 DIAGNOSIS — J069 Acute upper respiratory infection, unspecified: Secondary | ICD-10-CM

## 2016-07-03 DIAGNOSIS — I10 Essential (primary) hypertension: Secondary | ICD-10-CM

## 2016-07-03 DIAGNOSIS — E7800 Pure hypercholesterolemia, unspecified: Secondary | ICD-10-CM | POA: Diagnosis not present

## 2016-07-03 LAB — BASIC METABOLIC PANEL
BUN: 21 mg/dL (ref 7–25)
CO2: 28 mmol/L (ref 20–31)
Calcium: 8.9 mg/dL (ref 8.6–10.4)
Chloride: 102 mmol/L (ref 98–110)
Creat: 0.94 mg/dL (ref 0.50–0.99)
Glucose, Bld: 103 mg/dL — ABNORMAL HIGH (ref 65–99)
POTASSIUM: 3.6 mmol/L (ref 3.5–5.3)
SODIUM: 139 mmol/L (ref 135–146)

## 2016-07-03 LAB — LIPID PANEL
CHOL/HDL RATIO: 2.6 ratio (ref <5.0)
Cholesterol: 251 mg/dL — ABNORMAL HIGH (ref <200)
HDL: 96 mg/dL (ref >50)
LDL Cholesterol: 133 mg/dL — ABNORMAL HIGH (ref <100)
Triglycerides: 110 mg/dL (ref <150)
VLDL: 22 mg/dL (ref <30)

## 2016-07-03 NOTE — Progress Notes (Signed)
Subjective:  Kelly Mccann is a 70 y.o. female who presents for a follow up on HTN. She is checking her blood pressure at home and states she is getting consistent daily readings <130/85. For the past month she has stopped eating red meat and her BP is usually around 117/70s.  She has a history of hyperlipidemia and did not return for fasting labs as recommended by me. She is fasting today and would like to have her cholesterol checked. Has taken lovastatin in the past but stopped this in the past few months. States she thinks the medicine was causing her fingers and hands to hurt and have some numbness.   Has not started exercising but plans to start walking.   History of shingles and states she thought she was getting it again on her left side of her back. States she was having pain with movement in her left mid back region. No rash. Denies injury. Pain was dull and intermittent and nonradiating. She is no longer having this pain.  She also has a complaint of a rhinorrhea, dry cough with occasional  post nasal drainage.   Denies fever, chills,   Treatment to date: Coricidin .  Denies sick contacts.  No other aggravating or relieving factors.  No other c/o.  ROS as in subjective.   Objective: Vitals:   07/03/16 0925  BP: (!) 144/88  Pulse: 76    General appearance: Alert, WD/WN, no distress, is well appearing                             Skin: warm, no rash                           Head: no sinus tenderness                            Eyes: conjunctiva normal, corneas clear, PERRLA                            Ears: pearly TMs, external ear canals normal                          Nose: septum midline, turbinates swollen, with erythema and clear discharge             Mouth/throat: MMM, tongue normal, no pharyngeal erythema                           Neck: supple, no adenopathy, no thyromegaly, nontender                          Heart: RRR, normal S1, S2, no murmurs    Lungs: CTA bilaterally, no wheezes, rales, or rhonchi      Assessment: Essential hypertension - Plan: Basic metabolic panel  Acute URI  Hypercholesterolemia - Plan: Lipid panel    Plan: Discussed that her blood pressure appears to be well controlled based on her home readings. Will continue on current medication and she will continue eating a low salt and healthy diet. Recommend that she start walking for exercise. She stopped taking cholesterol medication has a history of elevated LDL. She is fasting we will check a lipid panel today. Discussed diagnosis and treatment  of URI. She reports feeling at least 75% better than when her symptoms started.  Suggested symptomatic OTC remedies. She may try an antihistamine such as Claritin or Allegra to see if this helps with drainage. Nasal saline spray for congestion.   Refused flu shot.  Plan to follow up pending labs. Recommend that she schedule an appointment for annual wellness and physical exam. She is due for a mammogram.

## 2016-07-03 NOTE — Patient Instructions (Addendum)
You can try an antihistamine like Claritin or Allegra for drainage if you want. Stay well hydrated.  Continue with healthy diet and start walking for exercise.  Keep track of your blood pressure and if you start seeing readings >140/80 on a regular basis then let me know.  We will call you with your lab results.  Return for an annual wellness visit at your convenience.   DASH Eating Plan DASH stands for "Dietary Approaches to Stop Hypertension." The DASH eating plan is a healthy eating plan that has been shown to reduce high blood pressure (hypertension). Additional health benefits may include reducing the risk of type 2 diabetes mellitus, heart disease, and stroke. The DASH eating plan may also help with weight loss. What do I need to know about the DASH eating plan? For the DASH eating plan, you will follow these general guidelines:  Choose foods with less than 150 milligrams of sodium per serving (as listed on the food label).  Use salt-free seasonings or herbs instead of table salt or sea salt.  Check with your health care provider or pharmacist before using salt substitutes.  Eat lower-sodium products. These are often labeled as "low-sodium" or "no salt added."  Eat fresh foods. Avoid eating a lot of canned foods.  Eat more vegetables, fruits, and low-fat dairy products.  Choose whole grains. Look for the word "whole" as the first word in the ingredient list.  Choose fish and skinless chicken or [place] more often than red meat. Limit fish, poultry, and meat to 6 oz (170 g) each day.  Limit sweets, desserts, sugars, and sugary drinks.  Choose heart-healthy fats.  Eat more home-cooked food and less restaurant, buffet, and fast food.  Limit fried foods.  Do not fry foods. Cook foods using methods such as baking, boiling, grilling, and broiling instead.  When eating at a restaurant, ask that your food be prepared with less salt, or no salt if possible. What foods can I  eat? Seek help from a dietitian for individual calorie needs. Grains  Whole grain or whole wheat bread. Brown rice. Whole grain or whole wheat pasta. Quinoa, bulgur, and whole grain cereals. Low-sodium cereals. Corn or whole wheat flour tortillas. Whole grain cornbread. Whole grain crackers. Low-sodium crackers. Vegetables  Fresh or frozen vegetables (raw, steamed, roasted, or grilled). Low-sodium or reduced-sodium tomato and vegetable juices. Low-sodium or reduced-sodium tomato sauce and paste. Low-sodium or reduced-sodium canned vegetables. Fruits  All fresh, canned (in natural juice), or frozen fruits. Meat and Other Protein Products  Ground beef (85% or leaner), grass-fed beef, or beef trimmed of fat. Skinless chicken or [place]. Ground chicken or [place]. Pork trimmed of fat. All fish and seafood. Eggs. Dried beans, peas, or lentils. Unsalted nuts and seeds. Unsalted canned beans. Dairy  Low-fat dairy products, such as skim or 1% milk, 2% or reduced-fat cheeses, low-fat ricotta or cottage cheese, or plain low-fat yogurt. Low-sodium or reduced-sodium cheeses. Fats and Oils  Tub margarines without trans fats. Light or reduced-fat mayonnaise and salad dressings (reduced sodium). Avocado. Safflower, olive, or canola oils. Natural peanut or almond butter. Other  Unsalted popcorn and pretzels. The items listed above may not be a complete list of recommended foods or beverages. Contact your dietitian for more options.  What foods are not recommended? Grains  White bread. White pasta. White rice. Refined cornbread. Bagels and croissants. Crackers that contain trans fat. Vegetables  Creamed or fried vegetables. Vegetables in a cheese sauce. Regular canned vegetables. Regular canned tomato  sauce and paste. Regular tomato and vegetable juices. Fruits  Canned fruit in light or heavy syrup. Fruit juice. Meat and Other Protein Products  Fatty cuts of meat. Ribs, chicken wings, bacon, sausage, bologna,  salami, chitterlings, fatback, hot dogs, bratwurst, and packaged luncheon meats. Salted nuts and seeds. Canned beans with salt. Dairy  Whole or 2% milk, cream, half-and-half, and cream cheese. Whole-fat or sweetened yogurt. Full-fat cheeses or blue cheese. Nondairy creamers and whipped toppings. Processed cheese, cheese spreads, or cheese curds. Condiments  Onion and garlic salt, seasoned salt, table salt, and sea salt. Canned and packaged gravies. Worcestershire sauce. Tartar sauce. Barbecue sauce. Teriyaki sauce. Soy sauce, including reduced sodium. Steak sauce. Fish sauce. Oyster sauce. Cocktail sauce. Horseradish. Ketchup and mustard. Meat flavorings and tenderizers. Bouillon cubes. Hot sauce. Tabasco sauce. Marinades. Taco seasonings. Relishes. Fats and Oils  Butter, stick margarine, lard, shortening, ghee, and bacon fat. Coconut, palm kernel, or palm oils. Regular salad dressings. Other  Pickles and olives. Salted popcorn and pretzels. The items listed above may not be a complete list of foods and beverages to avoid. Contact your dietitian for more information.  Where can I find more information? National Heart, Lung, and Blood Institute: travelstabloid.com This information is not intended to replace advice given to you by your health care provider. Make sure you discuss any questions you have with your health care provider. Document Released: 07/09/2011 Document Revised: 12/26/2015 Document Reviewed: 05/24/2013 Elsevier Interactive Patient Education  2017 [employer].

## 2016-07-06 ENCOUNTER — Ambulatory Visit: Payer: Medicare Other | Admitting: Family Medicine

## 2016-07-08 ENCOUNTER — Other Ambulatory Visit (INDEPENDENT_AMBULATORY_CARE_PROVIDER_SITE_OTHER): Payer: Medicare Other

## 2016-07-08 ENCOUNTER — Telehealth: Payer: Self-pay | Admitting: Internal Medicine

## 2016-07-08 DIAGNOSIS — R739 Hyperglycemia, unspecified: Secondary | ICD-10-CM

## 2016-07-08 LAB — POCT GLYCOSYLATED HEMOGLOBIN (HGB A1C)

## 2016-07-08 MED ORDER — LOVASTATIN 20 MG PO TABS: 20.0000 mg | ORAL_TABLET | Freq: Every day | ORAL | 0 refills | Status: DC

## 2016-07-08 NOTE — Telephone Encounter (Signed)
-----   Message from Girtha Rm, NP sent at 07/07/2016 10:12 PM EST ----- Please let her know that her cholesterol is elevated and does put her at an increased risk of heart disease. I recommend that she take a medication to lower her cholesterol. Is she willing to do this?

## 2016-07-08 NOTE — Telephone Encounter (Signed)
Coming in today

## 2016-07-15 DIAGNOSIS — M170 Bilateral primary osteoarthritis of knee: Secondary | ICD-10-CM | POA: Diagnosis not present

## 2016-07-15 DIAGNOSIS — M25562 Pain in left knee: Secondary | ICD-10-CM | POA: Diagnosis not present

## 2016-07-15 DIAGNOSIS — M25561 Pain in right knee: Secondary | ICD-10-CM | POA: Diagnosis not present

## 2016-07-22 ENCOUNTER — Encounter: Payer: Self-pay | Admitting: Family Medicine

## 2016-07-22 ENCOUNTER — Telehealth: Payer: Self-pay | Admitting: Family Medicine

## 2016-07-22 ENCOUNTER — Ambulatory Visit (INDEPENDENT_AMBULATORY_CARE_PROVIDER_SITE_OTHER): Payer: Medicare Other | Admitting: Family Medicine

## 2016-07-22 VITALS — BP 120/84 | HR 86 | Temp 97.4°F | Resp 16 | Wt 195.4 lb

## 2016-07-22 DIAGNOSIS — J4521 Mild intermittent asthma with (acute) exacerbation: Secondary | ICD-10-CM | POA: Diagnosis not present

## 2016-07-22 DIAGNOSIS — I10 Essential (primary) hypertension: Secondary | ICD-10-CM | POA: Diagnosis not present

## 2016-07-22 DIAGNOSIS — Z87891 Personal history of nicotine dependence: Secondary | ICD-10-CM

## 2016-07-22 DIAGNOSIS — J45901 Unspecified asthma with (acute) exacerbation: Secondary | ICD-10-CM

## 2016-07-22 MED ORDER — AZILSARTAN-CHLORTHALIDONE 40-25 MG PO TABS: 1.0000 | ORAL_TABLET | Freq: Every day | ORAL | 1 refills | Status: DC

## 2016-07-22 MED ORDER — ALBUTEROL SULFATE (2.5 MG/3ML) 0.083% IN NEBU
2.5000 mg | INHALATION_SOLUTION | Freq: Once | RESPIRATORY_TRACT | Status: AC
  Administered 2016-07-22: 2.5 mg via RESPIRATORY_TRACT

## 2016-07-22 MED ORDER — PREDNISONE 10 MG (21) PO TBPK: 10.0000 mg | ORAL_TABLET | Freq: Every day | ORAL | 0 refills | Status: DC

## 2016-07-22 MED ORDER — AZILSARTAN-CHLORTHALIDONE 40-25 MG PO TABS: 1.0000 | ORAL_TABLET | Freq: Every day | ORAL | 0 refills | Status: DC

## 2016-07-22 MED ORDER — AZITHROMYCIN 250 MG PO TABS: ORAL_TABLET | ORAL | 0 refills | Status: DC

## 2016-07-22 NOTE — Telephone Encounter (Signed)
Pt called and wants Prednisone called in for her Asthma to [facility] on [address]. Also states she has not yet received her mail order for her Lexicographer. Please advise pt [phone]

## 2016-07-22 NOTE — Telephone Encounter (Signed)
Pt coming in today to be evaluated and I have sent in med to mail order

## 2016-07-22 NOTE — Progress Notes (Signed)
Subjective: Chief Complaint  Patient presents with  . wheezing    wheezing- coughing. worse at night     Kelly Mccann is a 70 y.o. female with a history of asthma who presents for possible bronchitis.  Symptoms include a week long history of rhinorrhea, cough, wheezing and slight DOE. States she felt worse 6 days ago and has improved somewhat. Has been using her rescue inhaler every 4-6 hours for the past 2 days.  Denies fever, chills, headache, dizziness, chest pain, palpitations, orthopnea, back pain, abdominal pain, N/V/D.  States she was diagnosed with asthma approximately 1 1/2 years ago. States she was in the ED in January for bronchitis and acute asthma flare. States she was much worse in January.   Treatment to date: coricidan and proair.  ? sick contacts.   She does not smoke but does report a 30 year history of smoking and quit approximately 15 years ago.   She does have a history of bronchitis.   No other aggravating or relieving factors.  No other c/o.  The following portions of the patient's history were reviewed and updated as appropriate: allergies, current medications, past family history, past medical history, past social history, past surgical history and problem list.  ROS as in subjective  Past Medical History:  Diagnosis Date  . Arthritis   . Asthma   . Hx of adenomatous colonic polyps 04/09/2016  . Hypercholesterolemia   . Hypertension      Objective: Vital signs reviewed BP 120/84   Pulse 86   Temp 97.4 F (36.3 C) (Oral)   Resp 16   Wt 195 lb 6.4 oz (88.6 kg)   SpO2 96%   BMI 32.52 kg/m    General appearance: Alert, WD/WN, no distress, ill appearing                             Skin: warm, no rash, no diaphoresis                           Head: no sinus tenderness                            Eyes: conjunctiva normal, corneas clear, PERRLA                            Ears: pearly TMs, external ear canals normal                          Nose: septum  midline, turbinates swollen, with erythema and clear discharge             Mouth/throat: MMM, tongue normal, mild pharyngeal erythema                           Neck: supple, no adenopathy, no thyromegaly, nontender                          Heart: RRR, normal S1, S2, no murmurs                         Lungs: +bronchial breath sounds, +scattered rhonchi, expiratory wheezes throughout, no rales  Extremities: no edema, nontender, pulses normal        Assessment: Mild intermittent asthma with acute exacerbation - Plan: predniSONE (STERAPRED UNI-PAK 21 TAB) 10 MG (21) TBPK tablet, azithromycin (ZITHROMAX) 250 MG tablet, albuterol (PROVENTIL) (2.5 MG/3ML) 0.083% nebulizer solution 2.5 mg  Essential hypertension - Plan: Azilsartan-Chlorthalidone (EDARBYCLOR) 40-25 MG TABS  Former smoker, stopped smoking many years ago   Plan:  Discussed diagnosis and treatment of bronchitis and acute asthma flare.  Suggested symptomatic OTC remedies for cough and congestion.  Tylenol or Ibuprofen OTC for fever and malaise.    Medication orders today include: Breathing treatment given in office and patient reported significant improvement. Continues to have expiratory wheezing throughout but has slightly improved. No increased work of breathing, no accessory muscle use.  Plan to treat with Z pack and steroid does pack. Patient is in agreement that if she worsens she will go to the ED for further evaluation and treatment.  Call/return in 10-12 days for re-evaluation and possible PFTs.

## 2016-07-22 NOTE — Patient Instructions (Signed)
Take the antibiotic and the steroid dose pack. Use proair inhaler as needed. Return in 10 days or sooner if you get worse.  Stay well hydrated.

## 2016-08-04 ENCOUNTER — Encounter: Payer: Self-pay | Admitting: Family Medicine

## 2016-08-04 ENCOUNTER — Ambulatory Visit (INDEPENDENT_AMBULATORY_CARE_PROVIDER_SITE_OTHER): Payer: Medicare Other | Admitting: Family Medicine

## 2016-08-04 VITALS — BP 120/80 | HR 66 | Temp 98.1°F | Resp 16

## 2016-08-04 DIAGNOSIS — J4520 Mild intermittent asthma, uncomplicated: Secondary | ICD-10-CM | POA: Diagnosis not present

## 2016-08-04 DIAGNOSIS — R942 Abnormal results of pulmonary function studies: Secondary | ICD-10-CM | POA: Diagnosis not present

## 2016-08-04 DIAGNOSIS — Z87891 Personal history of nicotine dependence: Secondary | ICD-10-CM

## 2016-08-04 NOTE — Patient Instructions (Signed)
I am referring you to the pulmonologist (lung specialist) and they will call you.

## 2016-08-04 NOTE — Progress Notes (Signed)
   Subjective:    Patient ID: Kelly Mccann, female    DOB: 1946/04/13, 71 y.o.   MRN: [id number]  HPI Chief Complaint  Patient presents with  . 10 day follow-up    10 day follow-up. doing much better.   She is here for follow up on bronchitis and asthma flare, states she is doing well and feels fine.  She finished the antibiotic and steroid dose pak. Feels back to baseline.   Has not needed her rescue inhaler in 4 days.   Denies fever, chills, rhinorrhea, nasal congestion, sore throat, cough, shortness of breath, wheezing, chest pain, palpitations, abdominal pain, N/V/D, no LE edema.   States she was diagnosed with asthma approximately 3 years ago by her previous PCP. Denies having any official work up for asthma. States she has an asthma flare approximately once per year in the winter after having URI symptoms. Is not on maintenance medication and rarely needs her rescue inhaler.  States her only trigger seems to be when she gets a cold.   Former smoker and quit in 2001. States she smoked a pack per week for approximately 32 years.    Reviewed allergies, medications, past medical, surgical, and social history.    Review of Systems Pertinent positives and negatives in the history of present illness.     Objective:   Physical Exam BP 120/80   Pulse 66   Temp 98.1 F (36.7 C) (Oral)   Resp 16   SpO2 98%  Alert and in no distress. Pharyngeal area is normal. Neck is supple without adenopathy or thyromegaly. Cardiac exam shows a regular sinus rhythm without murmurs or gallops. Lungs are clear to auscultation.      Assessment & Plan:  Mild intermittent asthma without complication - Plan: Spirometry with Graph, Ambulatory referral to Pulmonology  Former light tobacco smoker - Plan: Ambulatory referral to Pulmonology  Abnormal PFT - Plan: Ambulatory referral to Pulmonology  Plan to refer her to pulmonologist for further evaluation due to abnormal PFT today. FEV1/FVC 57%  with interpretation stating moderately severe obstruction. She reports feeling at her baseline today.  Recent chest XR in August 2017 did not show pulmonary disease.

## 2016-08-06 ENCOUNTER — Encounter: Payer: Self-pay | Admitting: Internal Medicine

## 2016-08-11 ENCOUNTER — Ambulatory Visit: Payer: Medicare Other | Admitting: Family Medicine

## 2016-08-13 ENCOUNTER — Other Ambulatory Visit: Payer: Medicare Other

## 2016-08-13 ENCOUNTER — Ambulatory Visit (INDEPENDENT_AMBULATORY_CARE_PROVIDER_SITE_OTHER)
Admission: RE | Admit: 2016-08-13 | Discharge: 2016-08-13 | Disposition: A | Discharge status: Home / Self Care | Payer: Medicare Other | Source: Ambulatory Visit | Attending: Internal Medicine | Admitting: Internal Medicine

## 2016-08-13 ENCOUNTER — Ambulatory Visit (INDEPENDENT_AMBULATORY_CARE_PROVIDER_SITE_OTHER): Payer: Medicare Other | Admitting: Internal Medicine

## 2016-08-13 ENCOUNTER — Encounter: Payer: Self-pay | Admitting: Internal Medicine

## 2016-08-13 VITALS — BP 136/80 | HR 81 | Ht 67.0 in | Wt 197.2 lb

## 2016-08-13 DIAGNOSIS — J4540 Moderate persistent asthma, uncomplicated: Secondary | ICD-10-CM

## 2016-08-13 DIAGNOSIS — J4550 Severe persistent asthma, uncomplicated: Secondary | ICD-10-CM

## 2016-08-13 DIAGNOSIS — J439 Emphysema, unspecified: Secondary | ICD-10-CM | POA: Diagnosis not present

## 2016-08-13 DIAGNOSIS — J4530 Mild persistent asthma, uncomplicated: Secondary | ICD-10-CM | POA: Insufficient documentation

## 2016-08-13 LAB — NITRIC OXIDE: NITRIC OXIDE: 59

## 2016-08-13 MED ORDER — BUDESONIDE-FORMOTEROL FUMARATE 160-4.5 MCG/ACT IN AERO: INHALATION_SPRAY | RESPIRATORY_TRACT | 11 refills | Status: DC

## 2016-08-13 NOTE — Patient Instructions (Addendum)
Plan A = Automatic = Symbicort 160 Take 2 puffs first thing in am and then another 2 puffs about 12 hours later.    Plan B = Backup Only use your albuterol as a rescue medication to be used if you can't catch your breath by resting or doing a relaxed purse lip breathing pattern.  - The less you use it, the better it will work when you need it. - Ok to use the inhaler up to 2 puffs  every 4 hours if you must but call for appointment if use goes up over your usual need - Don't leave home without it !!  (think of it like the spare tire for your car)   Plan C = Crisis - only use your albuterol nebulizer if you first try Plan B and it fails to help > ok to use the nebulizer up to every 4 hours but if start needing it regularly call for immediate appointment  Please remember to go to the   x-ray department downstairs for your tests - we will call you with the results when they are available.  Please schedule a follow up office visit in 4 weeks, sooner if needed

## 2016-08-13 NOTE — Progress Notes (Signed)
Subjective:     Patient ID: Kelly Mccann, female   DOB: [birth year],    MRN: [id number]  HPI  6 yobf healthy all her life and  quit smoking 2000 at onset doe improved  Some after quit smoking and ok until 2014 with bad cold/ever since tendency to flares if sob  referred to pulmonary clinic 08/13/2016 by Dr   Raenette Rover   08/13/2016 1st [facility] office visit/ Wert   Chief Complaint  Patient presents with  . Pulmonary Consult    Referred by Harland Dingwall for eval of abnormal spirometry. She states she was dxed with Asthma back in 2014 b/c she "was wheezing and they gave me proair". She c/o SOB off and on for the past year. She gets SOB when walking fast or when she does alot of housework.  She states "feels like something in here"- pointing to her throat. She uses proair 2-3 x daily on average.    onset was 3 y prior to OV  With variable sob much better with prednisone but never on maint rx  On best days no alb use but no strings of good breathing last more than a few weeks then back to needing saba again avg 3 x daily   No obvious day to day or daytime variability or assoc excess/ purulent sputum or mucus plugs or hemoptysis or cp or chest tightness, subjective wheeze or overt sinus or hb symptoms. No unusual exp hx or h/o childhood pna/ asthma or knowledge of premature birth.  Sleeping ok without nocturnal  or early am exacerbation  of respiratory  c/o's or need for noct saba. Also denies any obvious fluctuation of symptoms with weather or environmental changes or other aggravating or alleviating factors except as outlined above   Current Medications, Allergies, Complete Past Medical History, Past Surgical History, Family History, and Social History were reviewed in [employer] record.  ROS  The following are not active complaints unless bolded sore throat, dysphagia, dental problems, itching, sneezing,  nasal congestion or excess/ purulent secretions, ear ache,    fever, chills, sweats, unintended wt loss, classically pleuritic or exertional cp,  orthopnea pnd or leg swelling, presyncope, palpitations, abdominal pain, anorexia, nausea, vomiting, diarrhea  or change in bowel or bladder habits, change in stools or urine, dysuria,hematuria,  rash, arthralgias, visual complaints, headache, numbness, weakness or ataxia or problems with walking or coordination,  change in mood/affect or memory.           Review of Systems     Objective:   Physical Exam    amb bf nad/ mild pseuodwheeze     Wt Readings from Last 3 Encounters:  08/13/16 197 lb 3.2 oz (89.4 kg)  07/22/16 195 lb 6.4 oz (88.6 kg)  04/07/16 192 lb 9.6 oz (87.4 kg)    Vital signs reviewed - Note on arrival 02 sats  97% on RA     HEENT: nl dentition, turbinates, and oropharynx. Nl external ear canals without cough reflex   NECK :  without JVD/Nodes/TM/ nl carotid upstrokes bilaterally   LUNGS: no acc muscle use,  Nl contour chest with upper and lower airway rhonchi    CV:  RRR  no s3 or murmur or increase in P2, nad no edema   ABD:  soft and nontender with nl inspiratory excursion in the supine position. No bruits or organomegaly appreciated, bowel sounds nl  MS:  Nl gait/ ext warm without deformities, calf tenderness, cyanosis or clubbing No  obvious joint restrictions   SKIN: warm and dry without lesions    NEURO:  alert, approp, nl sensorium with  no motor or cerebellar deficits apparent.    CXR PA and Lateral:   08/13/2016 :    I personally reviewed images and agree with radiology impression as follows:    Stable emphysematous changes. There is no acute cardiopulmonary disease.    Assessment:

## 2016-08-14 NOTE — Progress Notes (Signed)
Spoke with pt and notified of results per Dr. Wert. Pt verbalized understanding and denied any questions. 

## 2016-08-15 NOTE — Assessment & Plan Note (Addendum)
Spirometry 1/32/18   FEV1 1.15 (58%)  Ratio 57 with classic f/v for obst Spirometry 08/13/2016  FEV1 0.74 (35%)  Ratio 54   - 08/13/2016  After extensive coaching HFA effectiveness =    75% > trial of symbicort 160 2bid  - FENO 08/13/2016  =   59   Hx suggests she improves quite a bit between flares and even when not actively wheezing has very high feno, only really improves transiently though  p prednisone so whether we call this severe asthma/ copd with AB  or acos is moot issue, it represents very difficult to control airways dz that is at least partially steroid responsive   DDX of  difficult airways management almost all start with A and  include Adherence, Ace Inhibitors, Acid Reflux, Active Sinus Disease, Alpha 1 Antitripsin deficiency, Anxiety masquerading as Airways dz,  ABPA,  Allergy(esp in young), Aspiration (esp in elderly), Adverse effects of meds,  Active smokers, A bunch of PE's (a small clot burden can't cause this syndrome unless there is already severe underlying pulm or vascular dz with poor reserve) plus two Bs  = Bronchiectasis and Beta blocker use..and one C= CHF   In this case Adherence is the biggest issue and starts with  inability to use HFA effectively and also  understand that SABA treats the symptoms but doesn't get to the underlying problem (inflammation).  I used  the analogy of putting steroid cream on a rash to help explain the meaning of topical therapy and the need to get the drug to the target tissue.   - 08/13/2016  After extensive coaching HFA effectiveness =    75% so try symbicoryt 160 2 bid  - use trust but verify approach here > return with all meds.  ? Allergy > nothing in hx to suggest this/ hold off on further w/u for now   ? Gerd> at risk due to obesity, consider gerd rx next if remains dtc  Total time devoted to counseling  > 50 % of new pt 60 min office visit:  review case with pt/ discussion of options/alternatives/ personally creating written  customized instructions  in presence of pt  then going over those specific  Instructions directly with the pt including how to use all of the meds but in particular covering each new medication in detail and the difference between the maintenance/automatic meds and the prns using an action plan format for the latter.  Please see AVS from this visit for a full list of these instructions which I personally wrote for this pt and  are unique to this visit.

## 2016-08-15 NOTE — Assessment & Plan Note (Signed)
Body mass index is 30.89   Lab Results  Component Value Date   TSH 1.928 01/03/2008     Contributing to gerd tendency/ doe/reviewed the need and the process to achieve and maintain neg calorie balance > defer f/u primary care including intermittently monitoring thyroid status

## 2016-09-11 ENCOUNTER — Encounter: Payer: Self-pay | Admitting: Internal Medicine

## 2016-09-11 ENCOUNTER — Ambulatory Visit (INDEPENDENT_AMBULATORY_CARE_PROVIDER_SITE_OTHER): Payer: Medicare Other | Admitting: Internal Medicine

## 2016-09-11 VITALS — BP 142/88 | HR 75 | Ht 67.0 in | Wt 202.2 lb

## 2016-09-11 DIAGNOSIS — J4550 Severe persistent asthma, uncomplicated: Secondary | ICD-10-CM | POA: Diagnosis not present

## 2016-09-11 NOTE — Addendum Note (Signed)
Addended by: Jannette Spanner on: 09/11/2016 11:05 AM   Modules accepted: Orders

## 2016-09-11 NOTE — Progress Notes (Signed)
Subjective:     Patient ID: Kelly Mccann, female   DOB: [birth year]    MRN: [id number]    Brief patient profile:  71 yobf healthy all her life and  quit smoking 2000 at onset doe improved  Some after quit smoking and ok until 2014 with bad cold/ever since tendency to flares if sob  referred to pulmonary clinic 08/13/2016 by Dr   Raenette Rover    History of Present Illness  08/13/2016 1st [facility] office visit/ Wert   Chief Complaint  Patient presents with  . Pulmonary Consult    Referred by Harland Dingwall for eval of abnormal spirometry. She states she was dxed with Asthma back in 2014 b/c she "was wheezing and they gave me proair". She c/o SOB off and on for the past year. She gets SOB when walking fast or when she does alot of housework.  She states "feels like something in here"- pointing to her throat. She uses proair 2-3 x daily on average.   onset was 3 y prior to OV  With variable sob much better with prednisone but never on maint rx  On best days no alb use but no strings of good breathing last more than a few weeks then back to needing saba again avg 3 x daily  rec Plan A = Automatic = Symbicort 160 Take 2 puffs first thing in am and then another 2 puffs about 12 hours later.  Plan B = Backup Only use your albuterol as a rescue medication    09/11/2016  f/u ov/Wert re: chronic asthma vs acos vs copd on symb 160 2bid Chief Complaint  Patient presents with  . Follow-up    pt resports she is doing much better, f/u asthma   Not limited by breathing from desired activities  But not very active yet - plans to start - no need for saba at all   No obvious day to day or daytime variability or assoc chronic cough or cp or chest tightness, subjective wheeze or overt sinus or hb symptoms. No unusual exp hx or h/o childhood pna/ asthma or knowledge of premature birth.  Sleeping ok without nocturnal  or early am exacerbation  of respiratory  c/o's or need for noct saba. Also denies any  obvious fluctuation of symptoms with weather or environmental changes or other aggravating or alleviating factors except as outlined above   Current Medications, Allergies, Complete Past Medical History, Past Surgical History, Family History, and Social History were reviewed in [employer] record.  ROS  The following are not active complaints unless bolded sore throat, dysphagia, dental problems, itching, sneezing,  nasal congestion or excess/ purulent secretions, ear ache,   fever, chills, sweats, unintended wt loss, classically pleuritic or exertional cp, hemoptysis,  orthopnea pnd or leg swelling, presyncope, palpitations, abdominal pain, anorexia, nausea, vomiting, diarrhea  or change in bowel or bladder habits, change in stools or urine, dysuria,hematuria,  rash, arthralgias, visual complaints, headache, numbness, weakness or ataxia or problems with walking or coordination,  change in mood/affect or memory.                       Objective:   Physical Exam    amb bf nad   09/11/2016         202   08/13/16 197 lb 3.2 oz (89.4 kg)  07/22/16 195 lb 6.4 oz (88.6 kg)  04/07/16 192 lb 9.6 oz (87.4 kg)    Vital  signs reviewed - Note on arrival 02 sats  98% on RA     HEENT: nl dentition, turbinates, and oropharynx. Nl external ear canals without cough reflex   NECK :  without JVD/Nodes/TM/ nl carotid upstrokes bilaterally   LUNGS: no acc muscle use,  Nl contour chest and clear to A and P bilaterally    CV:  RRR  no s3 or murmur or increase in P2, nad no edema   ABD:  soft and nontender with nl inspiratory excursion in the supine position. No bruits or organomegaly appreciated, bowel sounds nl  MS:  Nl gait/ ext warm without deformities, calf tenderness, cyanosis or clubbing No obvious joint restrictions   SKIN: warm and dry without lesions    NEURO:  alert, approp, nl sensorium     CXR PA and Lateral:   08/13/2016 :    I personally reviewed images  and agree with radiology impression as follows:    Stable emphysematous changes. There is no acute cardiopulmonary disease.    Assessment:

## 2016-09-11 NOTE — Assessment & Plan Note (Signed)
Spirometry 1/32/18   FEV1 1.15 (58%)  Ratio 57 with classic f/v for obst Spirometry 08/13/2016  FEV1 0.74 (35%)  Ratio 54   - 08/13/2016  After extensive coaching HFA effectiveness =    75% > trial of symbicort 160 2bid  - FENO 08/13/2016  =   59   - The proper method of use, as well as anticipated side effects, of a metered-dose inhaler are discussed and demonstrated to the patient.  All goals of chronic asthma control met including optimal function and elimination of symptoms with minimal need for rescue therapy.  Contingencies discussed in full including contacting this office immediately if not controlling the symptoms using the rule of two's.     Each maintenance medication was reviewed in detail including most importantly the difference between maintenance and as needed and under what circumstances the prns are to be used.  Please see AVS for specific  Instructions which are unique to this visit and I personally typed out  which were reviewed in detail in writing with the patient and a copy provided.    Will do full pfts on return

## 2016-09-11 NOTE — Patient Instructions (Signed)
Plan A = Automatic = continue symbicort 160 Take 2 puffs first thing in am and then another 2 puffs about 12 hours later.   Work on maintaining perfect inhaler technique:  relax and gently blow all the way out then take a nice smooth deep breath back in, triggering the inhaler at same time you start breathing in.  Hold for up to 5 seconds if you can. Blow out thru nose. Rinse and gargle with water when done      Plan B = Backup Only use your albuterol as a rescue medication to be used if you can't catch your breath by resting or doing a relaxed purse lip breathing pattern.  - The less you use it, the better it will work when you need it. - Ok to use the inhaler up to 2 puffs  every 4 hours if you must but call for appointment if use goes up over your usual need - Don't leave home without it !!  (think of it like the spare tire for your car)      Please schedule a follow up visit in 3 months but call sooner if needed with full pfts on return

## 2016-09-15 ENCOUNTER — Ambulatory Visit (INDEPENDENT_AMBULATORY_CARE_PROVIDER_SITE_OTHER): Payer: Medicare Other | Admitting: Family Medicine

## 2016-09-15 ENCOUNTER — Encounter: Payer: Self-pay | Admitting: Family Medicine

## 2016-09-15 VITALS — BP 132/70 | HR 72 | Resp 16 | Ht 67.0 in | Wt 204.2 lb

## 2016-09-15 DIAGNOSIS — E7800 Pure hypercholesterolemia, unspecified: Secondary | ICD-10-CM

## 2016-09-15 DIAGNOSIS — Z1231 Encounter for screening mammogram for malignant neoplasm of breast: Secondary | ICD-10-CM

## 2016-09-15 DIAGNOSIS — Z1239 Encounter for other screening for malignant neoplasm of breast: Secondary | ICD-10-CM

## 2016-09-15 DIAGNOSIS — I10 Essential (primary) hypertension: Secondary | ICD-10-CM | POA: Diagnosis not present

## 2016-09-15 DIAGNOSIS — R7303 Prediabetes: Secondary | ICD-10-CM | POA: Insufficient documentation

## 2016-09-15 LAB — BASIC METABOLIC PANEL
BUN: 19 mg/dL (ref 7–25)
CHLORIDE: 101 mmol/L (ref 98–110)
CO2: 32 mmol/L — ABNORMAL HIGH (ref 20–31)
CREATININE: 1.18 mg/dL — AB (ref 0.50–0.99)
Calcium: 9.7 mg/dL (ref 8.6–10.4)
GLUCOSE: 102 mg/dL — AB (ref 65–99)
POTASSIUM: 3.6 mmol/L (ref 3.5–5.3)
Sodium: 142 mmol/L (ref 135–146)

## 2016-09-15 LAB — LIPID PANEL
Cholesterol: 200 mg/dL — ABNORMAL HIGH (ref <200)
HDL: 97 mg/dL (ref >50)
LDL CALC: 64 mg/dL (ref <100)
TRIGLYCERIDES: 194 mg/dL — AB (ref <150)
Total CHOL/HDL Ratio: 2.1 Ratio (ref <5.0)
VLDL: 39 mg/dL — AB (ref <30)

## 2016-09-15 NOTE — Progress Notes (Signed)
   Subjective:    Patient ID: Kelly Mccann, female    DOB: 1946-07-15, 71 y.o.   MRN: [id number]  HPI Chief Complaint  Patient presents with  . Medication Management    fasting, no concerns.    She is here to follow up on chronic health conditions HTN, hyperlipidemia and prediabetes.  She is seeing Dr. Melvyn Novas for asthma. States she is doing well. No asthma flares since her last visit.   Blood pressure is better controlled on new medication.  Denies dizziness, chest pain, palpitations, shortness of breath, cough, orthopnea, LE edema.  no GI or GU symptoms.   Her lipids were checked in December 2017 but she had not been taking her statin prior to labs. States she has been taking her medication consistently.   States she is walking over a mile per day 3 days per week. States she feels good when she exercises.  Eating healthy, more whole grains, less white pasta or foods and no red meat.   Mammogram over 1 yr ago. She is due for this.  Colonoscopy 03/2016 No paps d/t hysterectomy.  Family history: father passed away from heart disease and HTN. Brother with HTN. Aunt with leukemia. Mother passed away at age 41 with dementia.   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   Former smoker quit 12 years ago, smoked 20 years 2 packs per week, occasional alcohol use- shot glass of tequila or rum 2-3 times per month, no drug use   She declines pneumonia, flu or Tdap today.    Review of Systems Pertinent positives and negatives in the history of present illness.     Objective:   Physical Exam BP 132/70   Pulse 72   Resp 16   Ht 5\' 7"  (1.702 m)   Wt 204 lb 3.2 oz (92.6 kg)   SpO2 96%   BMI 31.98 kg/m   Alert and in no distress.Pharyngeal area is normal. Neck is supple without adenopathy or thyromegaly. Cardiac exam shows a regular sinus rhythm without murmurs or gallops. Lungs are clear to auscultation.      Assessment  & Plan:  Essential hypertension - Plan: Basic metabolic panel  Hypercholesterolemia - Plan: Lipid panel  Prediabetes - Plan: Basic metabolic panel, TSH  Screening for breast cancer - Plan: MM DIGITAL SCREENING BILATERAL  BP appears to be well controlled on current medication. Recommend she continue checking this at least once weekly at home. Continue healthy lifestyle modifications and medication.  Will check fasting lipids since she has been back on her cholesterol medication.  Mammogram ordered.  Advance directives given and discussed briefly. She will take these home and return if she has any questions.   She refused flu shot, Tdap and Pneumonia shots.  Follow up pending labs.

## 2016-09-16 LAB — TSH: TSH: 2.04 m[IU]/L

## 2016-09-18 ENCOUNTER — Other Ambulatory Visit: Payer: Self-pay | Admitting: Family Medicine

## 2016-10-07 ENCOUNTER — Ambulatory Visit
Admission: RE | Admit: 2016-10-07 | Discharge: 2016-10-07 | Disposition: A | Discharge status: Home / Self Care | Payer: Medicare Other | Source: Ambulatory Visit | Attending: Family Medicine | Admitting: Family Medicine

## 2016-10-07 DIAGNOSIS — Z1231 Encounter for screening mammogram for malignant neoplasm of breast: Secondary | ICD-10-CM | POA: Diagnosis not present

## 2016-10-07 DIAGNOSIS — Z1239 Encounter for other screening for malignant neoplasm of breast: Secondary | ICD-10-CM

## 2016-11-23 ENCOUNTER — Ambulatory Visit (INDEPENDENT_AMBULATORY_CARE_PROVIDER_SITE_OTHER): Payer: Medicare Other | Admitting: Family Medicine

## 2016-11-23 ENCOUNTER — Encounter: Payer: Self-pay | Admitting: Family Medicine

## 2016-11-23 VITALS — BP 122/80 | HR 86 | Temp 98.5°F | Resp 16 | Wt 203.4 lb

## 2016-11-23 DIAGNOSIS — J4550 Severe persistent asthma, uncomplicated: Secondary | ICD-10-CM | POA: Diagnosis not present

## 2016-11-23 DIAGNOSIS — J069 Acute upper respiratory infection, unspecified: Secondary | ICD-10-CM

## 2016-11-23 DIAGNOSIS — R140 Abdominal distension (gaseous): Secondary | ICD-10-CM

## 2016-11-23 DIAGNOSIS — J302 Other seasonal allergic rhinitis: Secondary | ICD-10-CM

## 2016-11-23 MED ORDER — DEXLANSOPRAZOLE 60 MG PO CPDR: 60.0000 mg | DELAYED_RELEASE_CAPSULE | Freq: Every day | ORAL | 0 refills | Status: DC

## 2016-11-23 MED ORDER — FLUTICASONE PROPIONATE 50 MCG/ACT NA SUSP: 2.0000 | Freq: Every day | NASAL | 2 refills | Status: DC

## 2016-11-23 NOTE — Progress Notes (Signed)
Subjective:    Patient ID: Kelly Mccann, female    DOB: 1945-12-31, 71 y.o.   MRN: [id number]  HPI Chief Complaint  Patient presents with  . fever    fever of 100 this morning. coughing up phelgm, horase, running nose, wants her gallbladder checked as she has pain sometimes and when she eats she feels gasy   She is here with complaints a 3 day history of of sneezing, rhinorrhea, nasal congestion, coughing and low grade fever. States last month she had a similar episode that lasted about a week and she got better on her own.   She reports history of seasonal allergies and mild intermittent asthma. States she just started taking daily Claritin. Reports daily use of symbicort. She is using her albuterol inhaler 3 times daily but states she is using it for "nasal congestion".  She is seeing Dr. Melvyn Novas for asthma.   Also reports having an intermittent bloating feeling after eating occasionally. Pain to LUQ with movement. This has been ongoing for several months. Is not getting worse. Questions whether these are gallbladder symptoms. Denies history of GERD.   Denies fever, chills, headache, dizziness, chest pain, palpitations, shortness of breath, wheezing, nausea, vomiting, diarrhea.   Reviewed allergies, medications, past medical, surgical, and social history.    Review of Systems Pertinent positives and negatives in the history of present illness.     Objective:   Physical Exam  Constitutional: She is oriented to person, place, and time. She appears well-developed and well-nourished. She does not have a sickly appearance. No distress.  HENT:  Right Ear: Tympanic membrane and ear canal normal.  Left Ear: Tympanic membrane and ear canal normal.  Nose: Mucosal edema present. Right sinus exhibits no maxillary sinus tenderness and no frontal sinus tenderness. Left sinus exhibits no maxillary sinus tenderness and no frontal sinus tenderness.  Mouth/Throat: Uvula is midline, oropharynx is  clear and moist and mucous membranes are normal.  Eyes: Conjunctivae are normal. Pupils are equal, round, and reactive to light.  Neck: Normal range of motion. Neck supple.  Cardiovascular: Normal heart sounds and normal pulses.   Pulmonary/Chest: Effort normal and breath sounds normal. She has no wheezes.  Abdominal: Soft. Normal appearance and bowel sounds are normal. There is no hepatosplenomegaly. There is no tenderness. There is no rigidity, no rebound, no guarding, no CVA tenderness, no tenderness at McBurney's point and negative Murphy's sign.  Lymphadenopathy:    She has no cervical adenopathy.       Right: No supraclavicular adenopathy present.       Left: No supraclavicular adenopathy present.  Neurological: She is alert and oriented to person, place, and time. She has normal strength. Coordination and gait normal.  Skin: Skin is warm and dry. No rash noted. No cyanosis. No pallor.  Psychiatric: She has a normal mood and affect. Her speech is normal and behavior is normal. Thought content normal.   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 Plan to have her continue on daily Claritin and start using Flonase to see if this helps her symptoms.  She does not appear to be having an acute asthma flare. Vitals are wnl.  Educated her on when to use albuterol inhaler and that this is not for nasal congestion.   Discussed that her abdominal symptoms do not appear to be  gallbladder related but will have her look for any triggers such as food or movement and try Dexilant to see if symptoms improve. Follow up in 2 weeks for abdominal bloating.

## 2016-11-23 NOTE — Patient Instructions (Addendum)
Start using the Flonase and continue on daily Claritin. Treat for allergies and see if this helps.   Only use the albuterol as needed for wheezing, shortness of breath or worsening cough.   Try the Dexilant once daily 30 minutes before breakfast for the next 10 days and see if this helps with your stomach issue. Follow up with me in 2 weeks for this.

## 2016-12-03 ENCOUNTER — Encounter: Payer: Self-pay | Admitting: Family Medicine

## 2016-12-03 ENCOUNTER — Ambulatory Visit (INDEPENDENT_AMBULATORY_CARE_PROVIDER_SITE_OTHER): Payer: Medicare Other | Admitting: Family Medicine

## 2016-12-03 VITALS — BP 110/70 | HR 75 | Temp 98.3°F | Resp 16 | Wt 205.6 lb

## 2016-12-03 DIAGNOSIS — Z23 Encounter for immunization: Secondary | ICD-10-CM

## 2016-12-03 DIAGNOSIS — J4550 Severe persistent asthma, uncomplicated: Secondary | ICD-10-CM

## 2016-12-03 DIAGNOSIS — K219 Gastro-esophageal reflux disease without esophagitis: Secondary | ICD-10-CM | POA: Diagnosis not present

## 2016-12-03 MED ORDER — ESOMEPRAZOLE MAGNESIUM 20 MG PO PACK: 20.0000 mg | PACK | Freq: Every day | ORAL | 2 refills | Status: DC

## 2016-12-03 NOTE — Patient Instructions (Signed)
As discussed, try to avoid food triggers that cause reflux and avoid eating and laying down. Eat small frequent meals. Take the antacid medication as needed, ideally you would not need to take this daily.  Continue taking daily Claritin and Flonase. Stay well hydrated.   You received your first of two pneumonia shots today.   Return for an annual wellness visit in the next few weeks.   Heartburn Heartburn is a type of pain or discomfort that can happen in the throat or chest. It is often described as a burning pain. It may also cause a bad taste in the mouth. Heartburn may feel worse when you lie down or bend over, and it is often worse at night. Heartburn may be caused by stomach contents that move back up into the esophagus (reflux). Follow these instructions at home: Take these actions to decrease your discomfort and to help avoid complications. Diet   Follow a diet as recommended by your health care provider. This may involve avoiding foods and drinks such as:  Coffee and tea (with or without caffeine).  Drinks that contain alcohol.  Energy drinks and sports drinks.  Carbonated drinks or sodas.  Chocolate and cocoa.  Peppermint and mint flavorings.  Garlic and onions.  Horseradish.  Spicy and acidic foods, including peppers, chili powder, curry powder, vinegar, hot sauces, and barbecue sauce.  Citrus fruit juices and citrus fruits, such as oranges, lemons, and limes.  Tomato-based foods, such as red sauce, chili, salsa, and pizza with red sauce.  Fried and fatty foods, such as donuts, french fries, potato chips, and high-fat dressings.  High-fat meats, such as hot dogs and fatty cuts of red and white meats, such as rib eye steak, sausage, ham, and bacon.  High-fat dairy items, such as whole milk, butter, and cream cheese.  Eat small, frequent meals instead of large meals.  Avoid drinking large amounts of liquid with your meals.  Avoid eating meals during the 2-3  hours before bedtime.  Avoid lying down right after you eat.  Do not exercise right after you eat. General instructions   Pay attention to any changes in your symptoms.  Take over-the-counter and prescription medicines only as told by your health care provider. Do not take aspirin, ibuprofen, or other NSAIDs unless your health care provider told you to do so.  Do not use any tobacco products, including cigarettes, chewing tobacco, and e-cigarettes. If you need help quitting, ask your health care provider.  Wear loose-fitting clothing. Do not wear anything tight around your waist that causes pressure on your abdomen.  Raise (elevate) the head of your bed about 6 inches (15 cm).  Try to reduce your stress, such as with yoga or meditation. If you need help reducing stress, ask your health care provider.  If you are overweight, reduce your weight to an amount that is healthy for you. Ask your health care provider for guidance about a safe weight loss goal.  Keep all follow-up visits as told by your health care provider. This is important. Contact a health care provider if:  You have new symptoms.  You have unexplained weight loss.  You have difficulty swallowing, or it hurts to swallow.  You have wheezing or a persistent cough.  Your symptoms do not improve with treatment.  You have frequent heartburn for more than two weeks. Get help right away if:  You have pain in your arms, neck, jaw, teeth, or back.  You feel sweaty, dizzy, or light-headed.  You have chest pain or shortness of breath.  You vomit and your vomit looks like blood or coffee grounds.  Your stool is bloody or black. This information is not intended to replace advice given to you by your health care provider. Make sure you discuss any questions you have with your health care provider. Document Released: 12/06/2008 Document Revised: 12/26/2015 Document Reviewed: 11/14/2014 Elsevier Interactive Patient  Education  2017 [employer].

## 2016-12-03 NOTE — Progress Notes (Signed)
   Subjective:    Patient ID: Kelly Mccann, female    DOB: [birth year], 71 y.o.   MRN: [id number]  HPI Chief Complaint  Patient presents with  . 2 week follow-up    2 week follow-up. still coughing up alot, still sounds like a cold   She is here for a follow on upper abdominal pain, bloating and URI symptoms. She has been taking Dexilant for GERD and states this helped her upper abdominal discomfort. Continues to have post nasal drainage, scratchy throat, and a cough that is not as productive.   States she is taking daily Claritin and using Flonase daily as well. Twice daily Symbicort and has not used her albuterol inhaler since our last visit.  Denies fever, chills, headache, dizziness, chest pain, palpitations, shortness of breath, orthopnea, wheezing, abdominal pain, N/V/D. No LE edema.   Reviewed allergies, medications, past medical, and social history.   Review of Systems Pertinent positives and negatives in the history of present illness.     Objective:   Physical Exam BP 110/70   Pulse 75   Temp 98.3 F (36.8 C) (Oral)   Resp 16   Wt 205 lb 9.6 oz (93.3 kg)   SpO2 96%   BMI 32.20 kg/m  Alert and in no distress. No sinus tenderness. Nares patent with erythema, edema and clear discharge. Tympanic membranes and canals are normal. Pharyngeal area is normal. Neck is supple without adenopathy or thyromegaly. Cardiac exam shows a regular sinus rhythm without murmurs or gallops. Lungs are clear to auscultation. Extremities without edema, normal pulses.        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 Review of her immunization history shows that she is  deficient in several immunizations. Will give her a pneumonia vaccine today. She is in agreement.  Reflux improved with Dexilant samples. Counseled on GERD management. Nexium prescribed with instructions to use as little as possible to control reflux symptoms.  URI symptoms have improved but she is still having issues with post nasal drainage and cough. She will continue taking daily claritin and Flonase. Reports good compliance with all medications including Symbicort twice daily. Question whether she would benefit from Singulair and would appreciate her discussing this with Dr. Melvyn Novas at her appointment next week.   I will see her back for a Medicare AWV and to follow up on GERD.

## 2016-12-09 DIAGNOSIS — H25093 Other age-related incipient cataract, bilateral: Secondary | ICD-10-CM | POA: Diagnosis not present

## 2016-12-10 ENCOUNTER — Encounter: Payer: Self-pay | Admitting: Internal Medicine

## 2016-12-10 ENCOUNTER — Ambulatory Visit (INDEPENDENT_AMBULATORY_CARE_PROVIDER_SITE_OTHER): Payer: Medicare Other | Admitting: Internal Medicine

## 2016-12-10 VITALS — BP 124/88 | HR 72 | Ht 66.0 in | Wt 202.0 lb

## 2016-12-10 DIAGNOSIS — J4550 Severe persistent asthma, uncomplicated: Secondary | ICD-10-CM

## 2016-12-10 DIAGNOSIS — J4540 Moderate persistent asthma, uncomplicated: Secondary | ICD-10-CM

## 2016-12-10 DIAGNOSIS — J4530 Mild persistent asthma, uncomplicated: Secondary | ICD-10-CM | POA: Diagnosis not present

## 2016-12-10 LAB — PULMONARY FUNCTION TEST
DL/VA % PRED: 65 %
DL/VA: 3.3 ml/min/mmHg/L
DLCO UNC % PRED: 64 %
DLCO cor % pred: 67 %
DLCO cor: 18.18 ml/min/mmHg
DLCO unc: 17.41 ml/min/mmHg
FEF 25-75 PRE: 0.81 L/s
FEF 25-75 Post: 1.01 L/sec
FEF2575-%CHANGE-POST: 24 %
FEF2575-%Pred-Post: 54 %
FEF2575-%Pred-Pre: 43 %
FEV1-%Change-Post: 11 %
FEV1-%PRED-PRE: 79 %
FEV1-%Pred-Post: 88 %
FEV1-PRE: 1.62 L
FEV1-Post: 1.81 L
FEV1FVC-%Change-Post: 6 %
FEV1FVC-%Pred-Pre: 77 %
FEV6-%Change-Post: 5 %
FEV6-%PRED-PRE: 105 %
FEV6-%Pred-Post: 111 %
FEV6-POST: 2.8 L
FEV6-Pre: 2.66 L
FEV6FVC-%CHANGE-POST: 0 %
FEV6FVC-%PRED-PRE: 102 %
FEV6FVC-%Pred-Post: 102 %
FVC-%CHANGE-POST: 5 %
FVC-%Pred-Post: 108 %
FVC-%Pred-Pre: 102 %
FVC-PRE: 2.7 L
FVC-Post: 2.84 L
POST FEV6/FVC RATIO: 99 %
PRE FEV1/FVC RATIO: 60 %
Post FEV1/FVC ratio: 64 %
Pre FEV6/FVC Ratio: 99 %
RV % PRED: 176 %
RV: 4.02 L
TLC % pred: 134 %
TLC: 7.21 L

## 2016-12-10 MED ORDER — BUDESONIDE-FORMOTEROL FUMARATE 160-4.5 MCG/ACT IN AERO: INHALATION_SPRAY | RESPIRATORY_TRACT | 0 refills | Status: DC

## 2016-12-10 NOTE — Progress Notes (Signed)
PFT done today. 

## 2016-12-10 NOTE — Assessment & Plan Note (Signed)
Spirometry 1/32/18   FEV1 1.15 (58%)  Ratio 57 with classic f/v for obst Spirometry 08/13/2016  FEV1 0.74 (35%)  Ratio 54   - 08/13/2016    trial of symbicort 160 2bid  - FENO 08/13/2016  =   59  - PFT's  12/10/2016  FEV1 1.81 (88 % ) ratio 64  p 11  % improvement from saba p symb 160 prior to study with DLCO  64/67 % corrects to 65  % for alv volume   - 12/10/2016  After extensive coaching HFA effectiveness =    90%   Clearly much more of an asthmatic than copd'r so needs to be treated as such    I had an extended final summary discussion with the patient reviewing all relevant studies completed to date and  lasting 15 to 20 minutes of a 25 minute visit on the following issues:    If your breathing worsens or you need to use your rescue inhaler more than twice weekly or wake up more than twice a month with any respiratory symptoms or require more than two rescue inhalers per year, we need to see you right away because this means we're not controlling the underlying problem (inflammation) adequately.  Rescue inhalers (albuterol) do not control inflammation and overuse can lead to unnecessary and costly consequences.  They can make you feel better temporarily but eventually they will quit working effectively much as sleep aids lead to more insomnia if used regularly.    Each maintenance medication was reviewed in detail including most importantly the difference between maintenance and as needed and under what circumstances the prns are to be used.  Please see AVS for specific  Instructions which are unique to this visit and I personally typed out  which were reviewed in detail in writing with the patient and a copy provided.   Pulmonary f/u is prn

## 2016-12-10 NOTE — Assessment & Plan Note (Signed)
Body mass index is 32.6 kg/m.  -  trending no change  Lab Results  Component Value Date   TSH 2.04 09/15/2016     Contributing to gerd risk/ doe/reviewed the need and the process to achieve and maintain neg calorie balance > defer f/u primary care including intermittently monitoring thyroid status

## 2016-12-10 NOTE — Progress Notes (Signed)
Subjective:     Patient ID: Kelly Mccann, female   DOB: [birth year]    MRN: [id number]    Brief patient profile:  65 yobf healthy all her life and  quit smoking 2000 at onset doe improved  Some after quit smoking and ok until 2014 with bad cold/ever since tendency to flares if sob  referred to pulmonary clinic 08/13/2016 by Dr   Raenette Rover    History of Present Illness  08/13/2016 1st [facility] office visit/ Kelly Mccann   Chief Complaint  Patient presents with  . Pulmonary Consult    Referred by Harland Dingwall for eval of abnormal spirometry. She states she was dxed with Asthma back in 2014 b/c she "was wheezing and they gave me proair". She c/o SOB off and on for the past year. She gets SOB when walking fast or when she does alot of housework.  She states "feels like something in here"- pointing to her throat. She uses proair 2-3 x daily on average.   onset was 3 y prior to OV  With variable sob much better with prednisone but never on maint rx  On best days no alb use but no strings of good breathing last more than a few weeks then back to needing saba again avg 3 x daily  rec Plan A = Automatic = Symbicort 160 Take 2 puffs first thing in am and then another 2 puffs about 12 hours later.  Plan B = Backup Only use your albuterol as a rescue medication    09/11/2016  f/u ov/Kelly Mccann re: chronic asthma vs acos vs copd on symb 160 2bid Chief Complaint  Patient presents with  . Follow-up    pt resports she is doing much better, f/u asthma  Not limited by breathing from desired activities  But not very active yet - plans to start - no need for saba at all  rec Plan A = Automatic = continue symbicort 160 Take 2 puffs first thing in am and then another 2 puffs about 12 hours later.  Work on maintaining perfect inhaler technique:  relax and gently blow all the way out then take a nice smooth deep breath back in, triggering the inhaler at same time you start breathing in.  Hold for up to 5 seconds if you  can. Blow out thru nose. Rinse and gargle with water when done Plan B = Backup Only use your albuterol as a rescue medication     12/10/2016  f/u ov/Kelly Mccann re:  Mild chronic asthma/min "copd" on symb 160 2bid  Chief Complaint  Patient presents with  . Follow-up    PFT's done today.  Pt c/o PND and cough for the past month. Cough is prod with clear sputum.  She has not needed the Proair.     Doing night and day better vs baseline and ever starting to work out at [employer] and no need for saba  No obvious day to day or daytime variability or assoc  purulent sputum or mucus plugs or hemoptysis or cp or chest tightness, subjective wheeze or overt sinus or hb symptoms. No unusual exp hx or h/o childhood pna/ asthma or knowledge of premature birth.  Sleeping ok without nocturnal  or early am exacerbation  of respiratory  c/o's or need for noct saba. Also denies any obvious fluctuation of symptoms with weather or environmental changes or other aggravating or alleviating factors except as outlined above   Current Medications, Allergies, Complete Past Medical History, Past  Surgical History, Family History, and Social History were reviewed in [employer] record.  ROS  The following are not active complaints unless bolded sore throat, dysphagia, dental problems, itching, sneezing,  nasal congestion or excess/ purulent secretions, ear ache,   fever, chills, sweats, unintended wt loss, classically pleuritic or exertional cp,  orthopnea pnd or leg swelling, presyncope, palpitations, abdominal pain, anorexia, nausea, vomiting, diarrhea  or change in bowel or bladder habits, change in stools or urine, dysuria,hematuria,  rash, arthralgias, visual complaints, headache, numbness, weakness or ataxia or problems with walking or coordination,  change in mood/affect or memory.               Objective:   Physical Exam    amb bf nad   12/10/2016       202   09/11/2016         202   08/13/16  197 lb 3.2 oz (89.4 kg)  07/22/16 195 lb 6.4 oz (88.6 kg)  04/07/16 192 lb 9.6 oz (87.4 kg)    Vital signs reviewed - Note on arrival 02 sats  96% on RA     HEENT: nl dentition, turbinates, and oropharynx. Nl external ear canals without cough reflex   NECK :  without JVD/Nodes/TM/ nl carotid upstrokes bilaterally   LUNGS: no acc muscle use,  Nl contour chest and clear to A and P bilaterally    CV:  RRR  no s3 or murmur or increase in P2, nad no edema   ABD:  soft and nontender with nl inspiratory excursion in the supine position. No bruits or organomegaly appreciated, bowel sounds nl  MS:  Nl gait/ ext warm without deformities, calf tenderness, cyanosis or clubbing No obvious joint restrictions   SKIN: warm and dry without lesions    NEURO:  alert, approp, nl sensorium          Assessment:

## 2016-12-10 NOTE — Patient Instructions (Signed)
Work on maintaining excellent inhaler technique:  relax and gently blow all the way out then take a nice smooth deep breath back in, triggering the inhaler at same time you start breathing in.  Hold for up to 5 seconds if you can. Blow out thru nose. Rinse and gargle with water when done    If you are satisfied with your treatment plan,  let your doctor know and he/she can either refill your medications or you can return here when your prescription runs out.     If in any way you are not 100% satisfied,  please tell us.  If 100% better, tell your friends!  Pulmonary follow up is as needed

## 2016-12-21 ENCOUNTER — Telehealth: Payer: Self-pay | Admitting: Family Medicine

## 2016-12-21 DIAGNOSIS — J069 Acute upper respiratory infection, unspecified: Secondary | ICD-10-CM

## 2016-12-21 DIAGNOSIS — J302 Other seasonal allergic rhinitis: Secondary | ICD-10-CM

## 2016-12-21 MED ORDER — FLUTICASONE PROPIONATE 50 MCG/ACT NA SUSP: 2.0000 | Freq: Every day | NASAL | 0 refills | Status: DC

## 2016-12-21 NOTE — Telephone Encounter (Signed)
done

## 2016-12-21 NOTE — Telephone Encounter (Signed)
Rcvd new prescription request to send Fluticasone spray 50 mcg to NEW PHARM[facility] at [facility]

## 2016-12-24 ENCOUNTER — Encounter: Payer: Self-pay | Admitting: Family Medicine

## 2016-12-24 ENCOUNTER — Ambulatory Visit (INDEPENDENT_AMBULATORY_CARE_PROVIDER_SITE_OTHER): Payer: Medicare Other | Admitting: Family Medicine

## 2016-12-24 VITALS — BP 120/80 | HR 74 | Ht 65.75 in | Wt 203.0 lb

## 2016-12-24 DIAGNOSIS — Z114 Encounter for screening for human immunodeficiency virus [HIV]: Secondary | ICD-10-CM | POA: Diagnosis not present

## 2016-12-24 DIAGNOSIS — Z0000 Encounter for general adult medical examination without abnormal findings: Secondary | ICD-10-CM | POA: Diagnosis not present

## 2016-12-24 DIAGNOSIS — J302 Other seasonal allergic rhinitis: Secondary | ICD-10-CM

## 2016-12-24 DIAGNOSIS — E2839 Other primary ovarian failure: Secondary | ICD-10-CM | POA: Diagnosis not present

## 2016-12-24 DIAGNOSIS — Z1159 Encounter for screening for other viral diseases: Secondary | ICD-10-CM

## 2016-12-24 DIAGNOSIS — I10 Essential (primary) hypertension: Secondary | ICD-10-CM

## 2016-12-24 DIAGNOSIS — J4530 Mild persistent asthma, uncomplicated: Secondary | ICD-10-CM | POA: Diagnosis not present

## 2016-12-24 DIAGNOSIS — K219 Gastro-esophageal reflux disease without esophagitis: Secondary | ICD-10-CM

## 2016-12-24 DIAGNOSIS — R7303 Prediabetes: Secondary | ICD-10-CM | POA: Diagnosis not present

## 2016-12-24 DIAGNOSIS — E7800 Pure hypercholesterolemia, unspecified: Secondary | ICD-10-CM | POA: Diagnosis not present

## 2016-12-24 LAB — CBC WITH DIFFERENTIAL/PLATELET
BASOS ABS: 38 {cells}/uL (ref 0–200)
BASOS PCT: 1 %
EOS PCT: 5 %
Eosinophils Absolute: 190 cells/uL (ref 15–500)
HCT: 40 % (ref 35.0–45.0)
HEMOGLOBIN: 13.5 g/dL (ref 11.7–15.5)
LYMPHS ABS: 1976 {cells}/uL (ref 850–3900)
Lymphocytes Relative: 52 %
MCH: 31.9 pg (ref 27.0–33.0)
MCHC: 33.8 g/dL (ref 32.0–36.0)
MCV: 94.6 fL (ref 80.0–100.0)
MPV: 9.4 fL (ref 7.5–12.5)
Monocytes Absolute: 304 cells/uL (ref 200–950)
Monocytes Relative: 8 %
NEUTROS ABS: 1292 {cells}/uL — AB (ref 1500–7800)
Neutrophils Relative %: 34 %
PLATELETS: 272 10*3/uL (ref 140–400)
RBC: 4.23 MIL/uL (ref 3.80–5.10)
RDW: 14.6 % (ref 11.0–15.0)
WBC: 3.8 10*3/uL — AB (ref 4.0–10.5)

## 2016-12-24 LAB — COMPLETE METABOLIC PANEL WITH GFR
ALBUMIN: 4.3 g/dL (ref 3.6–5.1)
ALK PHOS: 48 U/L (ref 33–130)
ALT: 17 U/L (ref 6–29)
AST: 24 U/L (ref 10–35)
BILIRUBIN TOTAL: 0.5 mg/dL (ref 0.2–1.2)
BUN: 19 mg/dL (ref 7–25)
CO2: 29 mmol/L (ref 20–31)
CREATININE: 1.19 mg/dL — AB (ref 0.50–0.99)
Calcium: 9.3 mg/dL (ref 8.6–10.4)
Chloride: 102 mmol/L (ref 98–110)
GFR, EST AFRICAN AMERICAN: 54 mL/min — AB (ref >=60)
GFR, EST NON AFRICAN AMERICAN: 47 mL/min — AB (ref >=60)
GLUCOSE: 101 mg/dL — AB (ref 65–99)
Potassium: 3.8 mmol/L (ref 3.5–5.3)
Sodium: 142 mmol/L (ref 135–146)
TOTAL PROTEIN: 6.9 g/dL (ref 6.1–8.1)

## 2016-12-24 LAB — LIPID PANEL
CHOLESTEROL: 195 mg/dL (ref <200)
HDL: 93 mg/dL (ref >50)
LDL Cholesterol: 80 mg/dL (ref <100)
TRIGLYCERIDES: 109 mg/dL (ref <150)
Total CHOL/HDL Ratio: 2.1 Ratio (ref <5.0)
VLDL: 22 mg/dL (ref <30)

## 2016-12-24 LAB — POCT URINALYSIS DIPSTICK
Bilirubin, UA: NEGATIVE
Blood, UA: NEGATIVE
Glucose, UA: NEGATIVE
KETONES UA: NEGATIVE
LEUKOCYTES UA: NEGATIVE
NITRITE UA: NEGATIVE
Spec Grav, UA: 1.01 (ref 1.010–1.025)
Urobilinogen, UA: NEGATIVE E.U./dL — AB
pH, UA: 7.5 (ref 5.0–8.0)

## 2016-12-24 NOTE — Patient Instructions (Signed)
MEDICARE PREVENTATIVE SERVICES (FEMALE) AND PERSONALIZED PLAN for Kelly Mccann Dec 24, 2016  CONDITIONS OR RISKS IDENTIFIED TODAY: Continue healthy diet and exercise.   SPECIFIC RECOMMENDATIONS: Make sure you are getting at least 1,200 mg of calcium and this can be from your diet. Take 800 IU of vitamin D daily. Keep walking daily.  Call and schedule your bone density.   Influenza vaccine: due next season  Pneumococcal vaccine: will get 2nd one next year  Shingles vaccine: prescription given  Tdap vaccine: prescription given Colonoscopy: due in 2020 Mammogram: due after October 07 2017 Pelvic exam: declined Pap smear: N/A  Take an Aspirin 81mg  nightly for heart health  Return in 6 months for follow up or sooner if needed.    GENERAL RECOMMENDATIONS FOR GOOD HEALTH:  Supplements:  . Take a daily baby Aspirin 81mg  at bedtime for heart health unless you have a history of gastrointestinal bleed, allergy to aspirin, or are already taking higher dose Aspirin or other antiplatelet or blood thinner medication.   . Consume 1200 mg of Calcium daily through dietary calcium or supplement if you are female age 33 or older, or men 42 and older.   Men aged 80-70 should consume 1000 mg of Calcium daily. . Take 600 IU of Vitamin D daily.  Take 800 IU of Calcium daily if you are older than age 14.  . Take a general multivitamin daily.   Healthy diet: Eat a variety of foods, including fruits, vegetables, vegetable protein such as beans, lentils, tofu, and grains, such as rice.  Limit meat or animal protein, but if you eat meat, choose leans cuts such as chicken, fish, or [place].  Drink plenty of water daily.  Decrease saturated fat in the diet, avoid lots of red meat, processed foods, sweets, fast foods, and fried foods.  Limit salt and caffeine intake.  Exercise: Aerobic exercise helps maintain good heart health. Weight bearing exercise helps keep bones and muscles working strong.  We recommend at  least 30-40 minutes of exercise most days of the week.   Fall prevention: Falls are the leading cause of injuries, accidents, and accidental deaths in people over the age of 70. Falling is a real threat to your ability to live on your own.  Causes include poor eyesight or poor hearing, illness, poor lighting, throw rugs, clutter in your home, and medication side effects causing dizziness or balance problems.  Such medications can include medications for depression, sleep problems, high blood pressure, diabetes, and heart conditions.   PREVENTION  Be sure your home is as safe as possible. Here are some tips:  Wear shoes with non-skid soles (not house slippers).   Be sure your home and outside area are well lit.   Use night lights throughout your house, including hallways and stairways.   Remove clutter and clean up spills on floors and walkways.   Remove throw rugs or fasten them to the floor with carpet tape. Tack down carpet edges.   Do not place electrical cords across pathways.   Install grab bars in your bathtub, shower, and toilet area. Towel bars should not be used as a grab bar.   Install handrails on both sides of stairways.   Do not climb on stools or stepladders. Get someone else to help with jobs that require climbing.   Do not wax your floors at all, or use a non-skid wax.   Repair uneven or unsafe sidewalks, walkways or stairs.   Keep frequently used items within  reach.   Be aware of pets so you do not trip.  Get regular check-ups from your doctor, and take good care of yourself:  Have your eyes checked every year for vision changes, cataracts, glaucoma, and other eye problems. Wear eyeglasses as directed.   Have your hearing checked every 2 years, or anytime you or others think that you cannot hear well. Use hearing aids as directed.   See your caregiver if you have foot pain or corns. Sore feet can contribute to falls.   Let your caregiver know if a medicine is  making you feel dizzy or making you lose your balance.   Use a cane, walker, or wheelchair as directed. Use walker or wheelchair brakes when getting in and out.   When you get up from bed, sit on the side of the bed for 1 to 2 minutes before you stand up. This will give your blood pressure time to adjust, and you will feel less dizzy.   If you need to go to the bathroom often, consider using a bedside commode.  Disease prevention:  If you smoke or chew tobacco, find out from your caregiver how to quit. It can literally save your life, no matter how long you have been a tobacco user. If you do not use tobacco, never begin. Medicare does cover some smoking cessation counseling.  Maintain a healthy diet and normal weight. Increased weight leads to problems with blood pressure and diabetes. We check your height, weight, and BMI as part of your yearly visit.  The Body Mass Index or BMI is a way of measuring how much of your body is fat. Having a BMI above 27 increases the risk of heart disease, diabetes, hypertension, stroke and other problems related to obesity. Your caregiver can help determine your BMI and based on it develop an exercise and dietary program to help you achieve or maintain this important measurement at a healthful level.  High blood pressure causes heart and blood vessel problems.  Persistent high blood pressure should be treated with medicine if weight loss and exercise do not work.  We check your blood pressure as part of your yearly visit.  Avoid drinking alcohol in excess (more than two drinks per day).  Avoid use of street drugs. Do not share needles with anyone. Ask for professional help if you need assistance or instructions on stopping the use of alcohol, cigarettes, and/or drugs.  Brush your teeth twice a day with fluoride toothpaste, and floss once a day. Good oral hygiene prevents tooth decay and gum disease. The problems can be painful, unattractive, and can cause other  health problems. Visit your dentist for a routine oral and dental checkup and preventive care every 6-12 months.   See your eye doctor yearly for routine screening for things like glaucoma.  Look at your skin regularly.  Use a mirror to look at your back. Notify your caregivers of changes in moles, especially if there are changes in shapes, colors, a size larger than a pencil eraser, an irregular border, or development of new moles.  Safety:  Use seatbelts 100% of the time, whether driving or as a passenger.  Use safety devices such as hearing protection if you work in environments with loud noise or significant background noise.  Use safety glasses when doing any work that could send debris in to the eyes.  Use a helmet if you ride a bike or motorcycle.  Use appropriate safety gear for contact sports.  Talk  to your caregiver about gun safety.  Use sunscreen with a SPF (or skin protection factor) of 15 or greater.  Lighter skinned people are at a greater risk of skin cancer. Don't forget to also wear sunglasses in order to protect your eyes from too much damaging sunlight. Damaging sunlight can accelerate cataract formation.   If you have multiple sexual partners, or if you are not in a monogamous relationship, practice safe sex. Use condoms. Condoms are used to help reduce the spread of sexually transmitted infections (or STIs).  Consider an HIV test if you have never been tested.  Consider routine screening for STIs if you have multiple sexual partners.   Keep carbon monoxide and smoke detectors in your home functioning at all times. Change the batteries every 6 months or use a model that plugs into the wall or is hard wired in.   END OF LIFE PLANNING/ADVANCED DIRECTIVES Advance health-care planning is deciding the kind of care you want at the end of life. While alert competent adults are able to exercise their rights to make health care and financial decisions, problems arise when an individual  becomes unconscious, incapacitated, or otherwise unable to communicate or make such decisions. Advance health care directives are the legal documents in which you give written instructions about your choices limited, aggressive or palliative care if, in the future, you cannot speak for yourself.  Advanced directives include the following: [place] allows you to appoint someone to act as your health care agent to make health care decisions for you should it be determined by your health care provider that you are no longer able to make these decisions for yourself.  A Living Will is a legal document in which you can declare that under certain conditions you desire your life not be prolonged by extraordinary or artificial means during your last illness or when you are near death. We can provide you with sample advanced directives, you can get an attorney to prepare these for you, or you can visit [place] Secretary of State's website for additional information and resources at [email or web address]  Further, I recommend you have an attorney prepare a Will and Durable Power of Attorney if you haven't done so already.  Please get [place] a copy of your health care Advanced Directives.   PREVENTATIV E CARE RECOMMENDATIONS:  Vaccinations: We recommend the following vaccinations as part of your preventative care:  Pneumococcal vaccine is recommended to protect against certain types of pneumonia.  This is normally recommended for adults age 66 or older once, or up to every 5 years for those at high risk.  The vaccine is also recommended for adults younger than 69 years old with certain underlying conditions that make them high risk for pneumonia.  Influenza vaccine is recommended to protect against seasonal influenza or "the flu." Influenza is a serious disease that can lead to hospitalization and sometimes even death. Traditional flu vaccines (called trivalent vaccines) are made  to protect against three flu viruses; an influenza A (H1N1) virus, an influenza A (H3N2) virus, and an influenza B virus. In addition, there are flu vaccines made to protect against four flu viruses (called "quadrivalent" vaccines). These vaccines protect against the same viruses as the trivalent vaccine and an additional B virus.  We recommend the high dose influenza vaccine to those 65 years and older.  Hepatitis B vaccine to protect against a form of infection of the liver by a virus acquired from blood or body  fluids, particularly for high risk groups.  Td or Tdap vaccine to protect against Tetanus, diphtheria and pertussis which can be very serious.  These diseases are caused by bacteria.  Diphtheria and pertussis are spread from person to person through coughing or sneezing.  Tetanus enters the body through cuts, scratches, or wounds.  Tetanus (Lockjaw) causes painful muscle tightening and stiffness, usually all over the body.  Diphtheria can cause a thick coating to form in the back of the throat.  It can lead to breathing problems, paralysis, heart failure, and death.  Pertussis (Whooping Cough) causes severe coughing spells, which can cause difficulty breathing, vomiting and disturbed sleep.  Td or Tdap is usually given every 10 years.  Shingles vaccine to protect against Varicella Zoster if you are older than age 19, or younger than 69 years old with certain underlying illness.    Cancer Screening: Most routine colon cancer screening begins at the age of 36.  Subsequent colonoscopies are performed either every 5-10 years for normal screening, or every 2-5 years for higher risks patients, up until age 27 years of age. Annual screening is done with easy to use take-home tests to check for hidden blood in the stool called hemoccult tests.  Sigmoidoscopy or colonoscopy can detect the earliest forms of colon cancer and is life saving. These tests use a small camera at the end of a tube to directly  examine the colon.   Pelvic Exam and Pap Smear: Pelvic exams and pap smears are performed routinely to evaluate for abnormalities as well as cancers including cervical and vaginal cancers.  This is generally performed every 2-3 years for most women, or more frequently for higher risk patients.  Mammograms: Mammograms are used to screen for breast cancer.  Medicare covers baseline screening once from ages 62-59 years old, but will cover mammograms yearly for those 40 years and older.  In accordance with other guidelines, you may not need a mammogram every year though.  The decision on how frequently you need a mammogram should be discussed with you medical provider.    Osteoporosis Screening: Screening for osteoporosis usually begins at age 69 for women, and can be done as frequent as every 2 years.  However, women or men with higher risk of osteoporosis may be screened earlier than age 22.  Osteoporosis or low bone mass is diminished bone strength from alterations in bone architecture leading to bone fragility and increased fracture risk.     Cardiovascular Screening: Fat and cholesterol leaves deposits in your arteries that can block them. This causes heart disease and vessel disease elsewhere in your body.  If your cholesterol is found to be high, or if you have heart disease or certain other medical conditions, then you may need to have your cholesterol monitored frequently and be treated with medication. Cardiovascular screening in the form of lab tests for cholesterol, HDL and triglycerides can be done every 5 years.  A screening electrocardiogram can be done as part of the Welcome to Medicare physical.  Diabetes Screening: Diabetes screening can be done at least every 3 years for those with risk factors,  or every 6-28months for prediabetic patients.  Screening includes fasting blood sugar test or glucose tolerance test.  Risk factors include hypertension, dyslipidemia, obesity, previously  abnormal glucose tests, family history of diabetes, age 68 years or older, and history of gestations diabetes.   AAA (abdominal aortic aneurysm) Screening: Medicare allows for a one time ultrasound to screen for abdominal aortic aneurysm  if done as a referral as part of the Welcome to Medicare exam.  Men eligible for this screening include those men between age 21-35 years of age who have smoked at least 100 cigarettes in his lifetime and/or has a family history of AAA.  HIV Screening:  Medicare allows for yearly screening for patients at high risk for contracting HIV disease.

## 2016-12-24 NOTE — Progress Notes (Signed)
Kelly Mccann is a 71 y.o. female who presents for annual wellness visit and follow-up on chronic medical conditions.  She has the following concerns: No concerns or complaints.  Asthma- States she is breathing much better. States she was told by Dr. Melvyn Novas that she does not have COPD, only asthma.  HTN- doing well. Checks her BP at home occasionally.  GERD- 2 times per week. Does not usually take medication for this.  Prediabetes- has made diet changes and is exercising.  Taking cholesterol medication without any issues.     Immunization History  Administered Date(s) Administered  . Pneumococcal Conjugate-13 12/03/2016   Last Pap smear: partial hysterectomy in 1989 - left her right ovary.  Last mammogram: March 2018- normal  Last colonoscopy: April 03 2016- due in 2020  Last DEXA: never  Dentist: dentures  Ophtho: last week Dr. [name]. Cataract developing  Exercise: daily - goes to the [facility]. Walks 1-2 miles   Other doctors caring for patient include: Dr. [name]- eyes Dr. Melvyn Novas - pulmonologist  Dr. Carlean Purl- GI   Depression screen:  See questionnaire below.  Depression screen PHQ 2/9 12/24/2016  Decreased Interest 0  Down, Depressed, Hopeless 0  PHQ - 2 Score 0    Fall Risk Screen: see questionnaire below. Fall Risk  12/24/2016  Falls in the past year? No    ADL screen:  See questionnaire below Functional Status Survey: Is the patient deaf or have difficulty hearing?: No Does the patient have difficulty seeing, even when wearing glasses/contacts?: No Does the patient have difficulty concentrating, remembering, or making decisions?: No Does the patient have difficulty walking or climbing stairs?: No Does the patient have difficulty dressing or bathing?: No Does the patient have difficulty doing errands alone such as visiting a doctor's office or shopping?: No   End of Life Discussion:  Patient has a living will and medical power of attorney. She brought this in  and we will scan it.   Review of Systems Constitutional: -fever, -chills, -sweats, -unexpected weight change, -anorexia, -fatigue Allergy: -sneezing, -itching, -congestion Dermatology: denies changing moles, rash, lumps, new worrisome lesions ENT: -runny nose, -ear pain, -sore throat, -hoarseness, -sinus pain, -teeth pain, -tinnitus, -hearing loss, -epistaxis Cardiology:  -chest pain, -palpitations, -edema, -orthopnea, -paroxysmal nocturnal dyspnea Respiratory: -cough, -shortness of breath, -dyspnea on exertion, -wheezing, -hemoptysis Gastroenterology: -abdominal pain, -nausea, -vomiting, -diarrhea, -constipation, -blood in stool, -changes in bowel movement, -dysphagia Hematology: -bleeding or bruising problems Musculoskeletal: -arthralgias, -myalgias, -joint swelling, -back pain, -neck pain, -cramping, -gait changes Ophthalmology: -vision changes, -eye redness, -itching, -discharge Urology: -dysuria, -difficulty urinating, -hematuria, -urinary frequency, -urgency, incontinence Neurology: -headache, -weakness, -tingling, -numbness, -speech abnormality, -memory loss, -falls, -dizziness Psychology:  -depressed mood, -agitation, -sleep problems    PHYSICAL EXAM:  BP 120/80   Pulse 74   Ht 5' 5.75" (1.67 m)   Wt 203 lb (92.1 kg)   BMI 33.01 kg/m   General Appearance: Alert, cooperative, no distress, appears stated age Head: Normocephalic, without obvious abnormality, atraumatic Eyes: PERRL, conjunctiva/corneas clear, EOM's intact, fundi benign Ears: Normal TM's and external ear canals Nose: Nares normal, mucosa normal, no drainage or sinus   tenderness Throat: Lips, mucosa, and tongue normal; teeth and gums normal Neck: Supple, no lymphadenopathy; thyroid: no enlargement/tenderness/nodules; no carotid bruit or JVD Back: Spine nontender, no curvature, ROM normal, no CVA tenderness Lungs: Clear to auscultation bilaterally without wheezes, rales or ronchi; respirations unlabored Chest  Wall: No tenderness or deformity Heart: Regular rate and rhythm, S1 and S2 normal,  no murmur, rub or gallop Breast Exam: No tenderness, masses, or nipple discharge or inversion. No axillary lymphadenopathy Abdomen: Soft, non-tender, nondistended, normoactive bowel sounds, no masses, no hepatosplenomegaly Genitalia: refused Extremities: No clubbing, cyanosis or edema Pulses: 2+ and symmetric all extremities Skin: Skin color, texture, turgor normal, no rashes or lesions Lymph nodes: Cervical, supraclavicular, and axillary nodes normal Neurologic: CNII-XII intact, normal strength, sensation and gait; reflexes 2+ and symmetric throughout Psych: Normal mood, affect, hygiene and grooming.  ASSESSMENT/PLAN: Medicare annual wellness visit, subsequent - Plan: CBC with Differential/Platelet  Essential hypertension  Gastroesophageal reflux disease, esophagitis presence not specified  Hypercholesterolemia - Plan: Lipid panel  Prediabetes  Routine general medical examination at a health care facility - Plan: Urinalysis Dipstick, CBC with Differential/Platelet, Lipid panel, COMPLETE METABOLIC PANEL WITH GFR  Need for hepatitis C screening test - Plan: Hepatitis C antibody  Screening for HIV (human immunodeficiency virus) - Plan: HIV antibody  Estrogen deficiency - Plan: DG Bone Density  Mild persistent asthma without complication  Seasonal allergic rhinitis, unspecified trigger   She appears to be doing well physically and emotionally. No sign of depression. No falls.  She is eating healthy and exercising daily.  HTN- blood pressure is within goal range, she will continue current medication spot check her BP at home and report back elevated readings.  GERD- managing with lifestyle modifications and does not require medication.  Seasonal allergies- takes Claritin daily Asthma- well controlled, no flares. Continue current medication.  Prediabetes- discussed healthy diet and exercise.   Hypercholesterolemia- she is taking a statin, exercising and eating healthier. Will recheck today.  She reports having had a stress test and seeing a cardiologist in 2006 or 2007. We will attempt to get these records.  Hep C screening performed.   HIV screening performed.   Bone density ordered- this will be her first one ever.  Up to date on mammogram.  Will be due for colonoscopy in 2020- history of polyps.  Prescription given for Tdap and Shingrix. She will call her insurance company prior to getting these. She will be due for Influenza and 2nd pneumonia injection next year.  Advance directives copied and sent to be scanned.  Follow up in 6 months pending labs.    Discussed monthly self breast exams and yearly mammograms; at least 30 minutes of aerobic activity at least 5 days/week and weight-bearing exercise 2x/week; proper sunscreen use reviewed; healthy diet, including goals of calcium and vitamin D intake and alcohol recommendations (less than or equal to 1 drink/day) reviewed; regular seatbelt use; changing batteries in smoke detectors.  Immunization recommendations discussed.  Colonoscopy recommendations reviewed   Medicare Attestation I have personally reviewed: The patient's medical and social history Their use of alcohol, tobacco or illicit drugs Their current medications and supplements The patient's functional ability including ADLs,fall risks, home safety risks, cognitive, and hearing and visual impairment Diet and physical activities Evidence for depression or mood disorders  The patient's weight, height, and BMI have been recorded in the chart.  I have made referrals, counseling, and provided education to the patient based on review of the above and I have provided the patient with a written personalized care plan for preventive services.     Harland Dingwall, NP-C   12/24/2016

## 2016-12-25 ENCOUNTER — Telehealth: Payer: Self-pay | Admitting: Family Medicine

## 2016-12-25 LAB — HIV ANTIBODY (ROUTINE TESTING W REFLEX): HIV 1&2 Ab, 4th Generation: NONREACTIVE

## 2016-12-25 LAB — HEPATITIS C ANTIBODY: HCV AB: NEGATIVE

## 2016-12-25 NOTE — Telephone Encounter (Signed)
Received record request back that was sent to [facility]. They do not have any records on this pt.

## 2016-12-25 NOTE — Telephone Encounter (Signed)
Pt does not remember who she saw.

## 2016-12-25 NOTE — Telephone Encounter (Signed)
Let her know that [place] does not have any record of her going there in the past. Can she remember who see saw ?

## 2016-12-31 ENCOUNTER — Encounter: Payer: Self-pay | Admitting: Family Medicine

## 2016-12-31 ENCOUNTER — Ambulatory Visit
Admission: RE | Admit: 2016-12-31 | Discharge: 2016-12-31 | Disposition: A | Discharge status: Home / Self Care | Payer: Medicare Other | Source: Ambulatory Visit | Attending: Family Medicine | Admitting: Family Medicine

## 2016-12-31 DIAGNOSIS — M8589 Other specified disorders of bone density and structure, multiple sites: Secondary | ICD-10-CM | POA: Diagnosis not present

## 2016-12-31 DIAGNOSIS — E2839 Other primary ovarian failure: Secondary | ICD-10-CM

## 2016-12-31 DIAGNOSIS — M8580 Other specified disorders of bone density and structure, unspecified site: Secondary | ICD-10-CM | POA: Insufficient documentation

## 2017-01-01 ENCOUNTER — Encounter: Payer: Self-pay | Admitting: Family Medicine

## 2017-01-18 IMAGING — DX DG CHEST 2V
3 series · 3 of 3 positions shown · non-contrast
Comparison: Chest x-ray dated August 18, 2015

CLINICAL DATA: History of asthma, hypertension, former smoker.
Routine follow-up. No current complaints.

EXAM:
CHEST  2 VIEW

[chest pa]
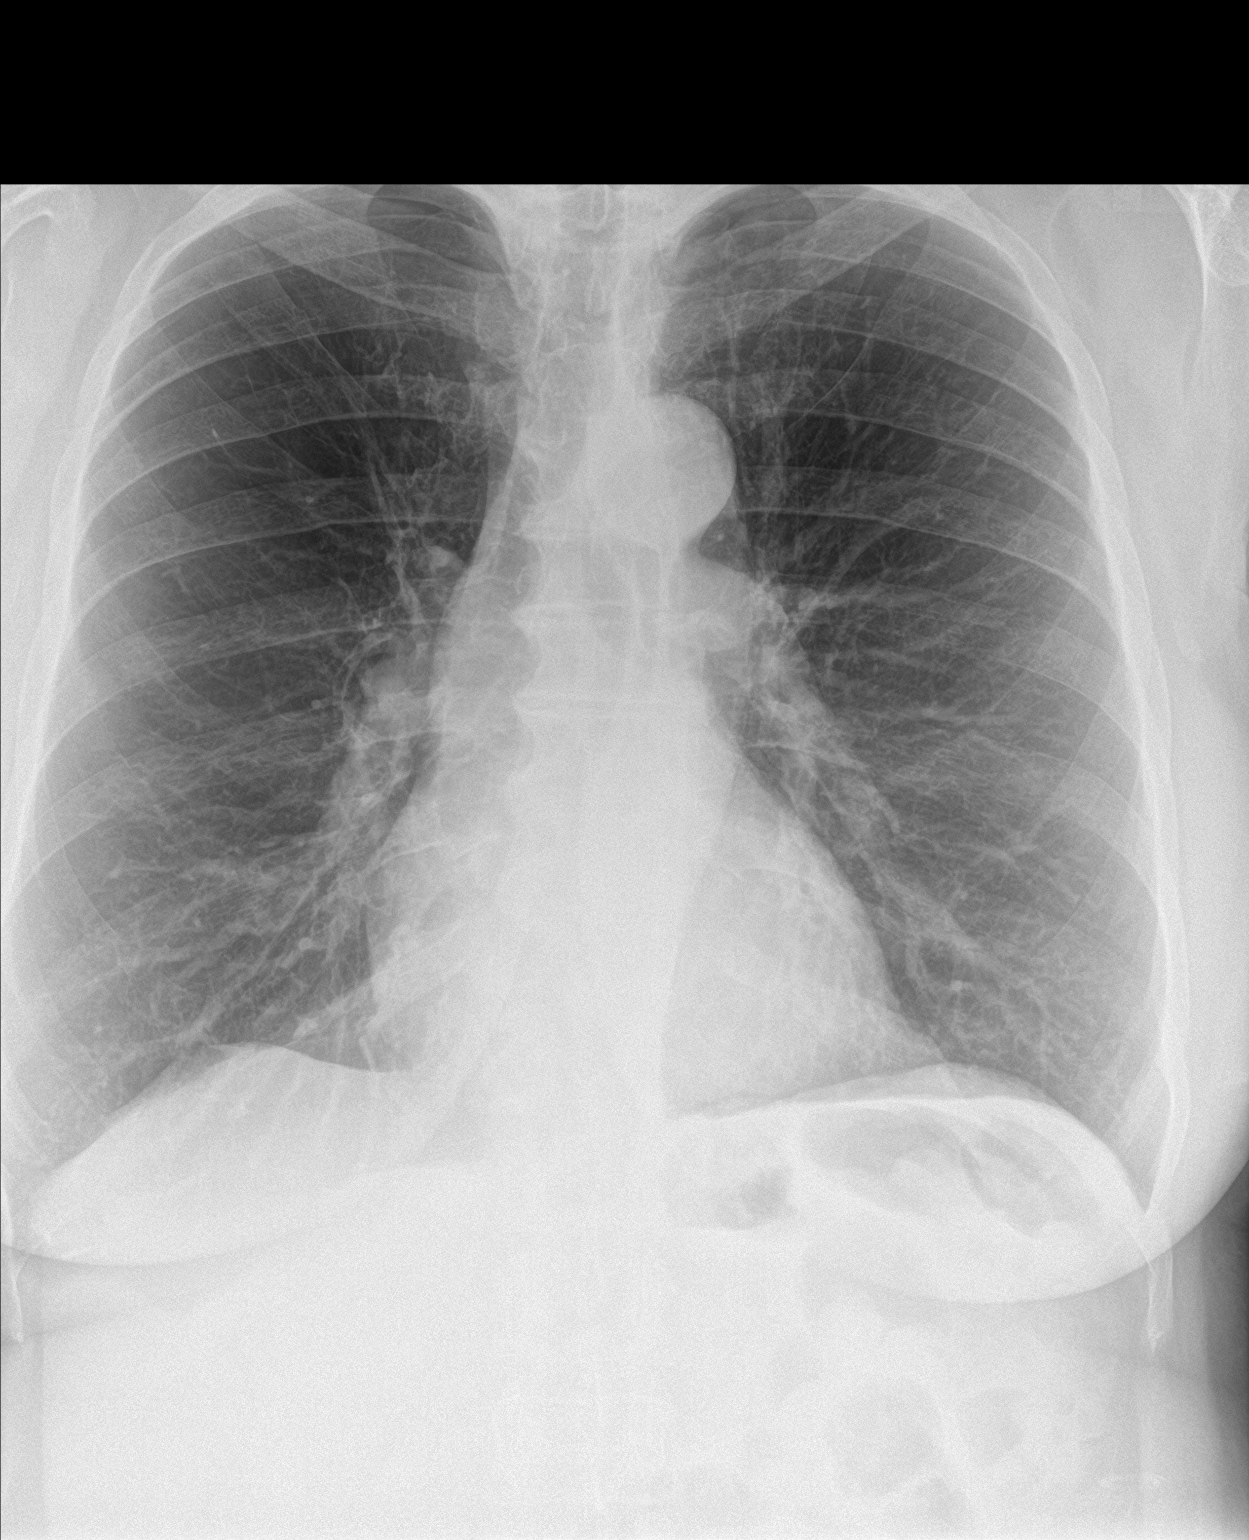

[chest lat]
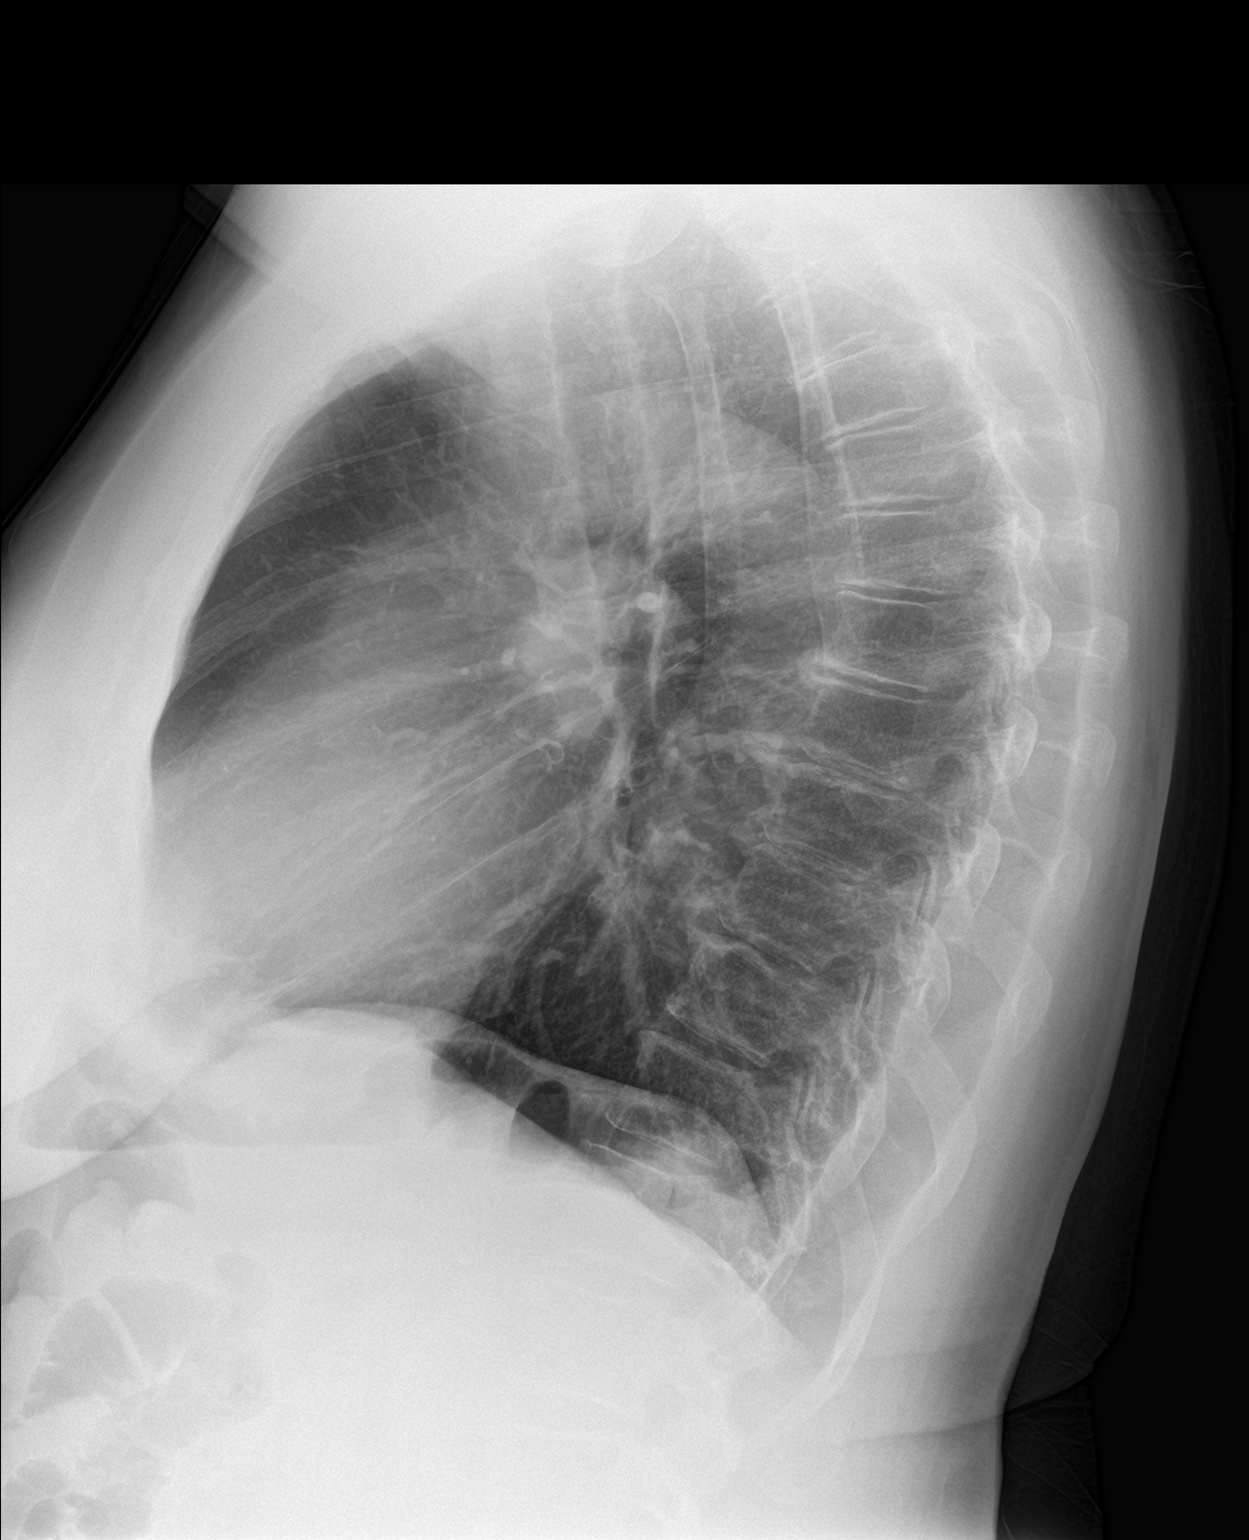

[chest ap]
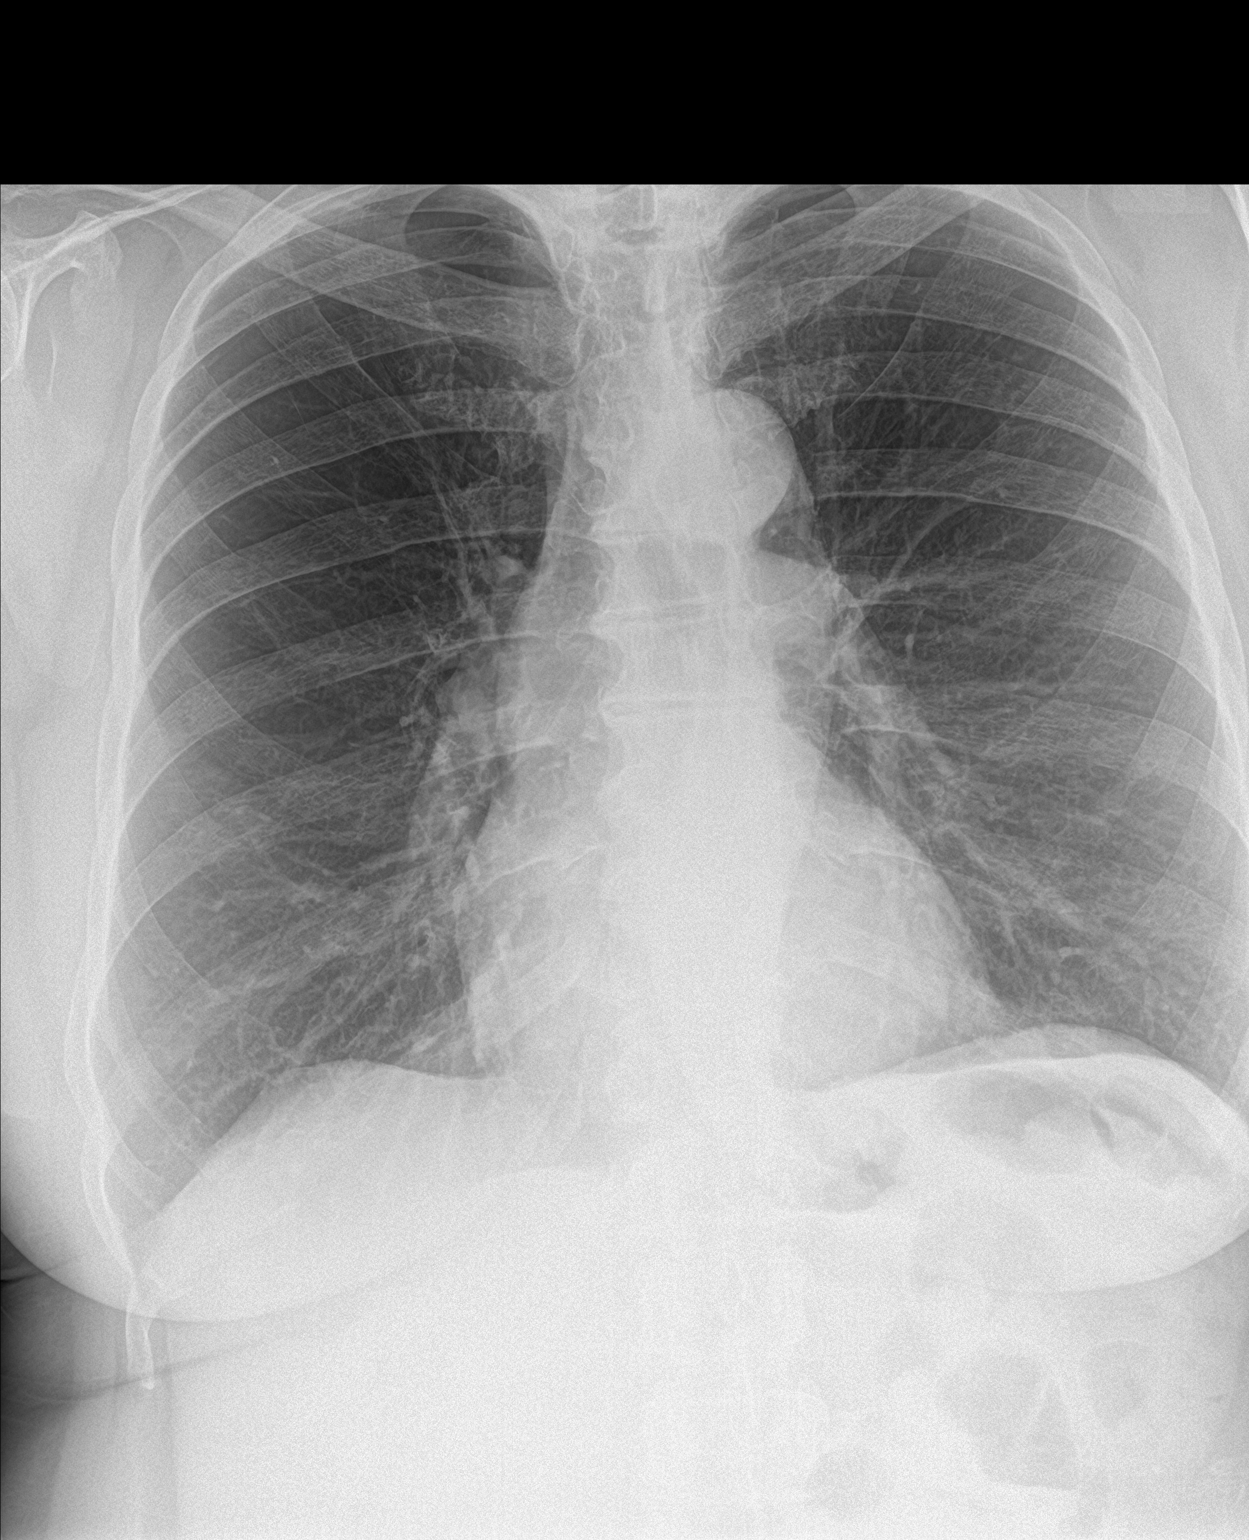

[3 of 3 positions shown; findings below may reference images not displayed]

FINDINGS: The lungs are mildly hyperinflated. The interstitial markings are
coarse bilaterally but stable. There is no alveolar infiltrate or
pleural effusion. The heart and pulmonary vascularity are normal.
The mediastinum is normal in width. The bony thorax exhibits no
acute abnormality.
IMPRESSION: Stable emphysematous changes. There is no acute cardiopulmonary
disease.

## 2017-02-06 ENCOUNTER — Other Ambulatory Visit: Payer: Self-pay | Admitting: Family Medicine

## 2017-02-06 DIAGNOSIS — I10 Essential (primary) hypertension: Secondary | ICD-10-CM

## 2017-03-29 ENCOUNTER — Encounter: Payer: Self-pay | Admitting: Family Medicine

## 2017-03-29 ENCOUNTER — Ambulatory Visit (INDEPENDENT_AMBULATORY_CARE_PROVIDER_SITE_OTHER): Payer: Medicare Other | Admitting: Family Medicine

## 2017-03-29 VITALS — BP 122/80 | HR 75 | Ht 66.0 in | Wt 206.4 lb

## 2017-03-29 DIAGNOSIS — N189 Chronic kidney disease, unspecified: Secondary | ICD-10-CM | POA: Diagnosis not present

## 2017-03-29 DIAGNOSIS — D709 Neutropenia, unspecified: Secondary | ICD-10-CM | POA: Insufficient documentation

## 2017-03-29 DIAGNOSIS — E7800 Pure hypercholesterolemia, unspecified: Secondary | ICD-10-CM

## 2017-03-29 DIAGNOSIS — E669 Obesity, unspecified: Secondary | ICD-10-CM | POA: Insufficient documentation

## 2017-03-29 DIAGNOSIS — I10 Essential (primary) hypertension: Secondary | ICD-10-CM

## 2017-03-29 DIAGNOSIS — K219 Gastro-esophageal reflux disease without esophagitis: Secondary | ICD-10-CM

## 2017-03-29 DIAGNOSIS — Z79899 Other long term (current) drug therapy: Secondary | ICD-10-CM | POA: Diagnosis not present

## 2017-03-29 DIAGNOSIS — R7303 Prediabetes: Secondary | ICD-10-CM | POA: Diagnosis not present

## 2017-03-29 DIAGNOSIS — M8580 Other specified disorders of bone density and structure, unspecified site: Secondary | ICD-10-CM | POA: Diagnosis not present

## 2017-03-29 LAB — CBC WITH DIFFERENTIAL/PLATELET
Basophils Absolute: 0 cells/uL (ref 0–200)
Basophils Relative: 0 %
EOS PCT: 6 %
Eosinophils Absolute: 270 cells/uL (ref 15–500)
HCT: 40.4 % (ref 35.0–45.0)
Hemoglobin: 13.4 g/dL (ref 11.7–15.5)
LYMPHS PCT: 45 %
Lymphs Abs: 2025 cells/uL (ref 850–3900)
MCH: 31.7 pg (ref 27.0–33.0)
MCHC: 33.2 g/dL (ref 32.0–36.0)
MCV: 95.5 fL (ref 80.0–100.0)
MONOS PCT: 9 %
MPV: 9.2 fL (ref 7.5–12.5)
Monocytes Absolute: 405 cells/uL (ref 200–950)
NEUTROS ABS: 1800 {cells}/uL (ref 1500–7800)
Neutrophils Relative %: 40 %
PLATELETS: 257 10*3/uL (ref 140–400)
RBC: 4.23 MIL/uL (ref 3.80–5.10)
RDW: 14.5 % (ref 11.0–15.0)
WBC: 4.5 10*3/uL (ref 4.0–10.5)

## 2017-03-29 LAB — POCT GLYCOSYLATED HEMOGLOBIN (HGB A1C)

## 2017-03-29 LAB — GLUCOSE, POCT (MANUAL RESULT ENTRY): POC GLUCOSE: 112 mg/dL — AB (ref 70–99)

## 2017-03-29 NOTE — Patient Instructions (Signed)
Continue taking your current medications and start back with exercising. I recommend walking outside 20-30 minutes at least 5 times per week.   We will call you with your lab results.   Follow up in 6 months.

## 2017-03-29 NOTE — Progress Notes (Signed)
   Subjective:    Patient ID: Kelly Mccann, female    DOB: 1946-04-06, 70 y.o.   MRN: [id number]  HPI Chief Complaint  Patient presents with  . 3 month follow-up    3 month follow-up on Blood pressure- running normal, Blood sugars- not checking, kidney function   She is here for a 3 month follow up on multiple chronic health conditions.  She has no concerns or new complaints today.   Pre-diabetes- last A1c 5.9%  Does not check blood sugars at home. No foot concerns.   States her diet is fairly healthy. She has gained weight. States she stopped walking but plans to start back.   States she is taking calcium and vitamin D for osteopenia.   HTN- BP at home is 120/80. No concerns with her medication.  She is aware that she has mild CKD.   Seasonal allergies- taking claritin. No recent flare up with allergies or asthma.   GERD- no medications. Avoids food triggers.   Elevated lipids- taking statin and no issues with this.   Denies fever, chills, dizziness, chest pain, palpitations, shortness of breath, abdominal pain, N/V/D, urinary symptoms, LE edema.   Reviewed allergies, medications, past medical, surgical,  and social history.   Review of Systems Pertinent positives and negatives in the history of present illness.     Objective:   Physical Exam BP 122/80   Pulse 75   Ht 5\' 6"  (1.676 m)   Wt 206 lb 6.4 oz (93.6 kg)   BMI 33.31 kg/m  Alert and in no distress. Tympanic membranes and canals are normal. Pharyngeal area is normal. Neck is supple without adenopathy or thyromegaly. Cardiac exam shows a regular sinus rhythm without murmurs or gallops. Lungs are clear to auscultation. Extremities without edema.       Assessment & Plan:  Prediabetes - Plan: POCT glucose (manual entry), HgB A1c, COMPLETE METABOLIC PANEL WITH GFR  Essential hypertension - Plan: CBC with Differential/Platelet, COMPLETE METABOLIC PANEL WITH GFR  Chronic kidney disease, unspecified CKD stage -  Plan: COMPLETE METABOLIC PANEL WITH GFR  Hypercholesterolemia  Gastroesophageal reflux disease, esophagitis presence not specified  Neutropenia, unspecified type ([place]) - Plan: CBC with Differential/Platelet  Medication management - Plan: VITAMIN D 25 Hydroxy (Vit-D Deficiency, Fractures)  Osteopenia determined by x-ray  Obesity (BMI 30.0-34.9)  Discussed that she is still in the pre-diabetes range but A1c is up slightly.  FBS 112 Hemoglobin A1c 6.1% today.  Does not check blood sugars at home and I am ok with this for now.  No known complications.  She plans to continue with diet and exercise. She has not been exercising over the summer and plans to start back. HTN- BP is within goal at home and in the office. She is doing well on Edarbyclor and will continue this. Eating low salt.  CKD- discussed keeping BP and blood sugars under control to keep CKD from worsening.  Lipids- she is doing well on medication. Exercise will help with this.  GERD- controlled with avoiding food triggers and lifestyle.  Osteopenia- taking vitamin D supplements 1,000IU daily. This is fairly new. Will check level today to determine appropriate dose of vitamin D. She is getting calcium in her diet and will start back with weight bearing exercise.   Declines flu shot today. Will consider this and return if she decides to do it. Encouraged her to get it.  Follow up pending labs or in 6 months.

## 2017-03-30 LAB — COMPLETE METABOLIC PANEL WITH GFR
ALT: 18 U/L (ref 6–29)
AST: 21 U/L (ref 10–35)
Albumin: 4.3 g/dL (ref 3.6–5.1)
Alkaline Phosphatase: 49 U/L (ref 33–130)
BILIRUBIN TOTAL: 0.5 mg/dL (ref 0.2–1.2)
BUN: 18 mg/dL (ref 7–25)
CHLORIDE: 103 mmol/L (ref 98–110)
CO2: 28 mmol/L (ref 20–32)
CREATININE: 1 mg/dL — AB (ref 0.50–0.99)
Calcium: 10 mg/dL (ref 8.6–10.4)
GFR, EST NON AFRICAN AMERICAN: 58 mL/min — AB (ref >=60)
GFR, Est African American: 66 mL/min (ref >=60)
GLUCOSE: 102 mg/dL — AB (ref 65–99)
Potassium: 3.4 mmol/L — ABNORMAL LOW (ref 3.5–5.3)
SODIUM: 141 mmol/L (ref 135–146)
TOTAL PROTEIN: 6.7 g/dL (ref 6.1–8.1)

## 2017-03-30 LAB — VITAMIN D 25 HYDROXY (VIT D DEFICIENCY, FRACTURES): Vit D, 25-Hydroxy: 31 ng/mL (ref 30–100)

## 2017-04-22 ENCOUNTER — Other Ambulatory Visit: Payer: Self-pay | Admitting: Internal Medicine

## 2017-04-22 DIAGNOSIS — J4540 Moderate persistent asthma, uncomplicated: Secondary | ICD-10-CM

## 2017-06-07 ENCOUNTER — Telehealth: Payer: Self-pay | Admitting: Internal Medicine

## 2017-06-07 NOTE — Telephone Encounter (Signed)
Got a refill request for flonase to [facility]. Is this okay to refill?

## 2017-06-07 NOTE — Telephone Encounter (Signed)
ok 

## 2017-06-08 MED ORDER — FLUTICASONE PROPIONATE 50 MCG/ACT NA SUSP: 2.0000 | Freq: Every day | NASAL | 0 refills | Status: DC

## 2017-06-08 NOTE — Telephone Encounter (Signed)
done

## 2017-07-20 ENCOUNTER — Other Ambulatory Visit: Payer: Self-pay | Admitting: Family Medicine

## 2017-07-20 DIAGNOSIS — I10 Essential (primary) hypertension: Secondary | ICD-10-CM

## 2017-09-13 ENCOUNTER — Ambulatory Visit: Payer: Medicare Other | Admitting: Family Medicine

## 2017-09-13 ENCOUNTER — Encounter: Payer: Self-pay | Admitting: Family Medicine

## 2017-09-13 VITALS — BP 110/78 | HR 62 | Wt 210.4 lb

## 2017-09-13 DIAGNOSIS — E669 Obesity, unspecified: Secondary | ICD-10-CM

## 2017-09-13 DIAGNOSIS — J4530 Mild persistent asthma, uncomplicated: Secondary | ICD-10-CM

## 2017-09-13 DIAGNOSIS — Z79899 Other long term (current) drug therapy: Secondary | ICD-10-CM | POA: Diagnosis not present

## 2017-09-13 DIAGNOSIS — R7303 Prediabetes: Secondary | ICD-10-CM

## 2017-09-13 DIAGNOSIS — I10 Essential (primary) hypertension: Secondary | ICD-10-CM | POA: Diagnosis not present

## 2017-09-13 DIAGNOSIS — E7800 Pure hypercholesterolemia, unspecified: Secondary | ICD-10-CM

## 2017-09-13 DIAGNOSIS — R635 Abnormal weight gain: Secondary | ICD-10-CM

## 2017-09-13 DIAGNOSIS — J302 Other seasonal allergic rhinitis: Secondary | ICD-10-CM

## 2017-09-13 LAB — POCT GLYCOSYLATED HEMOGLOBIN (HGB A1C)

## 2017-09-13 NOTE — Patient Instructions (Signed)
Your hemoglobin A1c is 6.0% today and this is still in pre diabetes range which is good.  Continue limiting carbohydrates (sugar, breads, sweets, rice, pasta, potatoes) and getting at least 150 minutes of exercise per week.   Your blood pressure is 110/78 and this is good. Continue on your current medications.   We will call you with your lab results.

## 2017-09-13 NOTE — Progress Notes (Signed)
   Subjective:    Patient ID: Kelly Mccann, female    DOB: [birth year], 72 y.o.   MRN: [id number]  HPI Chief Complaint  Patient presents with  . med check    med check   She is here for a fasting med check. Reports feeling in her usual state of health. Reports good medication compliance and no side effects. Does not check BP or blood sugars at home.  States her diet has been healthy and she does exercise.   Last Hgb A1c 6.1 and denies ever having diabetes.   States her weight is up in our office today but her clothes are fitting looser and she feels like she has lost weight.   No asthma flare ups. Taking Claritin, Flonase and symbicort. Has not needed albuterol inhaler.  Controls allergies with Flonase and Claritin.  Intermittent left ear "popping" no pain or post nasal drainage.   Denies fever, chills, dizziness, chest pain, palpitations, shortness of breath, abdominal pain, N/V/D, urinary symptoms, LE edema.   Reviewed allergies, medications, past medical, surgical, family, and social history.    Review of Systems  Pertinent positives and negatives in the history of present illness.     Objective:   Physical Exam BP 110/78   Pulse 62   Wt 210 lb 6.4 oz (95.4 kg)   BMI 33.96 kg/m   Alert and in no distress. Tympanic membranes and canals are normal. Pharyngeal area is normal. Neck is supple without adenopathy or thyromegaly. Cardiac exam shows a regular sinus rhythm without murmurs or gallops. Lungs are clear to auscultation. Extremities without edema.       Assessment & Plan:  Prediabetes - Plan: HgB A1c, Microalbumin/Creatinine Ratio, Urine, CBC with Differential/Platelet, Comprehensive metabolic panel, TSH, CANCELED: POCT Urinalysis DIP (Proadvantage Device)  Essential hypertension - Plan: CBC with Differential/Platelet, Comprehensive metabolic panel  Mild persistent asthma without complication  Seasonal allergic rhinitis, unspecified trigger  Obesity (BMI  30.0-34.9) - Plan: TSH  Hypercholesterolemia - Plan: Lipid panel  Medication management - Plan: Lipid panel  Unexplained weight gain - Plan: TSH  Discussed that she appears to be doing well.  Prediabetes- Hgb A1c 6.0% and diet controlled.  HTN- BP in a good range. Continue on current medication.  Asthma and allergies controlled. Continue on medication no recent flare ups.  We will check labs. Weight is up but she reports this does not sound accurate based on her clothing. No fluid retention. Check TSH.  Fasting and is taking statin without difficulty. Check lipids.  Follow up pending labs.

## 2017-09-14 LAB — CBC WITH DIFFERENTIAL/PLATELET
BASOS ABS: 0 10*3/uL (ref 0.0–0.2)
Basos: 1 %
EOS (ABSOLUTE): 0.3 10*3/uL (ref 0.0–0.4)
Eos: 6 %
Hematocrit: 40.3 % (ref 34.0–46.6)
Hemoglobin: 13 g/dL (ref 11.1–15.9)
IMMATURE GRANS (ABS): 0 10*3/uL (ref 0.0–0.1)
IMMATURE GRANULOCYTES: 0 %
LYMPHS: 47 %
Lymphocytes Absolute: 2.2 10*3/uL (ref 0.7–3.1)
MCH: 30.4 pg (ref 26.6–33.0)
MCHC: 32.3 g/dL (ref 31.5–35.7)
MCV: 94 fL (ref 79–97)
Monocytes Absolute: 0.3 10*3/uL (ref 0.1–0.9)
Monocytes: 8 %
NEUTROS PCT: 38 %
Neutrophils Absolute: 1.7 10*3/uL (ref 1.4–7.0)
PLATELETS: 272 10*3/uL (ref 150–379)
RBC: 4.27 x10E6/uL (ref 3.77–5.28)
RDW: 14.9 % (ref 12.3–15.4)
WBC: 4.6 10*3/uL (ref 3.4–10.8)

## 2017-09-14 LAB — TSH: TSH: 3.57 u[IU]/mL (ref 0.450–4.500)

## 2017-09-14 LAB — LIPID PANEL
CHOLESTEROL TOTAL: 165 mg/dL (ref 100–199)
Chol/HDL Ratio: 2.9 ratio (ref 0.0–4.4)
HDL: 57 mg/dL (ref >39)
LDL CALC: 60 mg/dL (ref 0–99)
TRIGLYCERIDES: 241 mg/dL — AB (ref 0–149)
VLDL Cholesterol Cal: 48 mg/dL — ABNORMAL HIGH (ref 5–40)

## 2017-09-14 LAB — COMPREHENSIVE METABOLIC PANEL
ALT: 24 IU/L (ref 0–32)
AST: 24 IU/L (ref 0–40)
Albumin/Globulin Ratio: 1.8 (ref 1.2–2.2)
Albumin: 4.4 g/dL (ref 3.5–4.8)
Alkaline Phosphatase: 58 IU/L (ref 39–117)
BUN/Creatinine Ratio: 14 (ref 12–28)
BUN: 16 mg/dL (ref 8–27)
Bilirubin Total: 0.3 mg/dL (ref 0.0–1.2)
CALCIUM: 9.5 mg/dL (ref 8.7–10.3)
CO2: 27 mmol/L (ref 20–29)
Chloride: 99 mmol/L (ref 96–106)
Creatinine, Ser: 1.17 mg/dL — ABNORMAL HIGH (ref 0.57–1.00)
GFR, EST AFRICAN AMERICAN: 55 mL/min/{1.73_m2} — AB (ref >59)
GFR, EST NON AFRICAN AMERICAN: 47 mL/min/{1.73_m2} — AB (ref >59)
GLUCOSE: 110 mg/dL — AB (ref 65–99)
Globulin, Total: 2.5 g/dL (ref 1.5–4.5)
Potassium: 3.9 mmol/L (ref 3.5–5.2)
Sodium: 142 mmol/L (ref 134–144)
TOTAL PROTEIN: 6.9 g/dL (ref 6.0–8.5)

## 2017-09-14 LAB — MICROALBUMIN / CREATININE URINE RATIO
Creatinine, Urine: 254.9 mg/dL
MICROALB/CREAT RATIO: 5.4 mg/g{creat} (ref 0.0–30.0)
MICROALBUM., U, RANDOM: 13.8 ug/mL

## 2017-10-01 ENCOUNTER — Other Ambulatory Visit: Payer: Self-pay

## 2017-10-01 DIAGNOSIS — J4540 Moderate persistent asthma, uncomplicated: Secondary | ICD-10-CM

## 2017-10-01 MED ORDER — BUDESONIDE-FORMOTEROL FUMARATE 160-4.5 MCG/ACT IN AERO: INHALATION_SPRAY | RESPIRATORY_TRACT | 0 refills | Status: DC

## 2017-10-04 ENCOUNTER — Telehealth: Payer: Self-pay | Admitting: Family Medicine

## 2017-10-04 DIAGNOSIS — J4540 Moderate persistent asthma, uncomplicated: Secondary | ICD-10-CM

## 2017-10-04 MED ORDER — BUDESONIDE-FORMOTEROL FUMARATE 160-4.5 MCG/ACT IN AERO: INHALATION_SPRAY | RESPIRATORY_TRACT | 0 refills | Status: DC

## 2017-10-04 NOTE — Telephone Encounter (Signed)
done

## 2017-10-04 NOTE — Telephone Encounter (Signed)
New Message   *STAT* If patient is at the pharmacy, call can be transferred to refill team.   1. Which medications need to be refilled? (please list name of each medication and dose if known)  Budesonide-formoterol (Symbicort) 160-4.5 mcg/ACT inhaler  2. Which pharmacy/location (including street and city if local pharmacy) is medication to be sent to? [place]  3. Do they need a 30 day or 90 day supply?  90 days supply

## 2017-10-08 ENCOUNTER — Telehealth: Payer: Self-pay

## 2017-10-08 ENCOUNTER — Other Ambulatory Visit: Payer: Self-pay | Admitting: Family Medicine

## 2017-10-08 DIAGNOSIS — J4540 Moderate persistent asthma, uncomplicated: Secondary | ICD-10-CM

## 2017-10-08 MED ORDER — BUDESONIDE-FORMOTEROL FUMARATE 160-4.5 MCG/ACT IN AERO: INHALATION_SPRAY | RESPIRATORY_TRACT | 0 refills | Status: DC

## 2017-10-08 NOTE — Telephone Encounter (Signed)
This should be fine 

## 2017-10-08 NOTE — Telephone Encounter (Signed)
done

## 2017-10-08 NOTE — Telephone Encounter (Signed)
[facility] wants to refill on symbicort. Please advise . Thanks [employer]

## 2017-11-03 ENCOUNTER — Telehealth: Payer: Self-pay | Admitting: Family Medicine

## 2017-11-03 NOTE — Telephone Encounter (Signed)
Please find out more information.  What side effects is she having?  We can reduce her dose and see how she does or we can try switching her medication altogether.  Does she feel like she needs to be seen?

## 2017-11-03 NOTE — Telephone Encounter (Signed)
Pt states that she is having like muscle spasms and her neck is tight and she can't relax at night to go to sleep. She also feels her legs get heavy in the afternoon. She would like to try reducing the dose. Please send in new med. She was also advised that once reducing the dose she still had symptoms to give [place] a call back.

## 2017-11-03 NOTE — Telephone Encounter (Signed)
Pt left message that her Carole Binning was too strong and she was having side effects.  She would like a weaker rx.  Pt ph [phone]

## 2017-11-03 NOTE — Telephone Encounter (Signed)
This may be related to other medications and not the BP medication. I recommend she come in for an office visit to discuss this. Have her bring in her BP readings from home and machine.

## 2017-11-03 NOTE — Telephone Encounter (Signed)
Pt coming in next week. Her bp machine reads higher than ours and she hasn't been really keeping up with her bp

## 2017-11-12 ENCOUNTER — Institutional Professional Consult (permissible substitution): Payer: Medicare Other | Admitting: Family Medicine

## 2017-11-26 ENCOUNTER — Other Ambulatory Visit: Payer: Self-pay | Admitting: Family Medicine

## 2017-11-26 DIAGNOSIS — J4540 Moderate persistent asthma, uncomplicated: Secondary | ICD-10-CM

## 2017-11-26 NOTE — Telephone Encounter (Signed)
[facility] is requesting to fill pt symbicort . Please advise [facility]

## 2017-12-09 ENCOUNTER — Other Ambulatory Visit: Payer: Self-pay | Admitting: Family Medicine

## 2017-12-09 DIAGNOSIS — I10 Essential (primary) hypertension: Secondary | ICD-10-CM

## 2018-01-03 DIAGNOSIS — H25093 Other age-related incipient cataract, bilateral: Secondary | ICD-10-CM | POA: Diagnosis not present

## 2018-01-03 DIAGNOSIS — H524 Presbyopia: Secondary | ICD-10-CM | POA: Diagnosis not present

## 2018-02-23 ENCOUNTER — Other Ambulatory Visit: Payer: Self-pay | Admitting: Family Medicine

## 2018-02-23 DIAGNOSIS — I10 Essential (primary) hypertension: Secondary | ICD-10-CM

## 2018-05-05 ENCOUNTER — Encounter: Payer: Self-pay | Admitting: Family Medicine

## 2018-05-05 ENCOUNTER — Ambulatory Visit: Payer: Medicare Other | Admitting: Family Medicine

## 2018-05-05 VITALS — BP 120/80 | HR 74 | Temp 98.2°F | Wt 201.6 lb

## 2018-05-05 DIAGNOSIS — M545 Low back pain, unspecified: Secondary | ICD-10-CM

## 2018-05-05 DIAGNOSIS — E781 Pure hyperglyceridemia: Secondary | ICD-10-CM | POA: Diagnosis not present

## 2018-05-05 DIAGNOSIS — N189 Chronic kidney disease, unspecified: Secondary | ICD-10-CM | POA: Diagnosis not present

## 2018-05-05 DIAGNOSIS — M8580 Other specified disorders of bone density and structure, unspecified site: Secondary | ICD-10-CM

## 2018-05-05 DIAGNOSIS — E669 Obesity, unspecified: Secondary | ICD-10-CM

## 2018-05-05 DIAGNOSIS — I700 Atherosclerosis of aorta: Secondary | ICD-10-CM | POA: Insufficient documentation

## 2018-05-05 DIAGNOSIS — R7303 Prediabetes: Secondary | ICD-10-CM | POA: Diagnosis not present

## 2018-05-05 DIAGNOSIS — J302 Other seasonal allergic rhinitis: Secondary | ICD-10-CM | POA: Diagnosis not present

## 2018-05-05 DIAGNOSIS — G8929 Other chronic pain: Secondary | ICD-10-CM

## 2018-05-05 DIAGNOSIS — I10 Essential (primary) hypertension: Secondary | ICD-10-CM

## 2018-05-05 DIAGNOSIS — J4530 Mild persistent asthma, uncomplicated: Secondary | ICD-10-CM | POA: Diagnosis not present

## 2018-05-05 LAB — POCT GLYCOSYLATED HEMOGLOBIN (HGB A1C): Hemoglobin A1C: 6 % — AB (ref 4.0–5.6)

## 2018-05-05 NOTE — Progress Notes (Signed)
Subjective:    Patient ID: Kelly Mccann, female    DOB: [birth year], 72 y.o.   MRN: [id number]  HPI Chief Complaint  Patient presents with  . med check    med check, possible gallstones, pain in back and radiates to the left to the right   She is here for a 6 month medication management visit. She also has a complaint of 1 year history of left low back pain that moves from her left low back to her left abdomen at times and then sometimes states the pain is on the right side. Pain is non radiating.  This is occurring 2-3 times per week. Standing upright for long periods with cooking or washing dishes causes pain to start. States sitting down improves her pain. She does not have to take medication for this. States the pain does not last long enough to take medication. No numbness, tingling, or weakness. No loss of control of bowels or bladder. Denies pain waking her up at night.  Denies fever, chills,  Pain is not related to eating.  No bloating or early satiety.   Denies leg or calf pain with walking.   She also reports having reports having intermittent constipation. States she has a bowel movement 3 times per week. This is her normal.  Taking Miralax some days as needed. No blood in stool.   HTN- Edarbyclor daily and no side effects.   Taking lovastatin every night.   Prediabetes- checking Hgb A1c. Diet controlled.   Diet is fairly healthy. Baked foods, low carbohydrates. Sweets occasionally.  Exercising 3 days per week.   GERD- controlled by diet and lifestyle measures. Not needing medication.   Asthma and allergies- taking symbicort bid. Uses Proair 0-1 x per week. No flares or concerns.  Taking Flonase and Claritin as needed.   Occasional wine. No smoking.   Osteopenia- taking calcium and vitamin D.   Father with MI in his late 82s.  Atherosclerosis on CT in past.  States she was evaluated by a cardiologist in past and reports having a negative stress.   Denies fever,  chills, dizziness, chest pain, palpitations, shortness of breath, wheezing, cough, abdominal pain, N/V/D, urinary symptoms, LE edema.   Reviewed allergies, medications, past medical, surgical, family, and social history.    Review of Systems Pertinent positives and negatives in the history of present illness.     Objective:   Physical Exam BP 120/80   Pulse 74   Temp 98.2 F (36.8 C) (Oral)   Wt 201 lb 9.6 oz (91.4 kg)   SpO2 98%   BMI 32.54 kg/m   Alert and in no distress. Pharyngeal area is normal. Neck is supple without adenopathy or thyromegaly. Cardiac exam shows a regular sinus rhythm without murmurs or gallops. Lungs are clear to auscultation.  Abdomen is soft, nondistended, normal bowel sounds, nontender, no palpable masses.  Negative Murphy's and McBurney's.  Cervical, thoracic and lumbar spine with normal motion.  Nontender.  Negative straight leg raise.  Bilateral lower extremities without edema, normal range of motion and strength.  Distal pulses intact.  Skin is warm and dry, no pallor.  PERRLA, EOMs intact, CNs intact      Assessment & Plan:  Essential hypertension - Plan: CBC with Differential/Platelet, Comprehensive metabolic panel  Mild persistent asthma without complication  Seasonal allergic rhinitis, unspecified trigger  Chronic kidney disease, unspecified CKD stage - Plan: Comprehensive metabolic panel  Prediabetes - Plan: HgB A1c, TSH, T4, free  Osteopenia  determined by x-ray  Obesity (BMI 30.0-34.9) - Plan: CBC with Differential/Platelet, Comprehensive metabolic panel, TSH, Lipid panel  Hypertriglyceridemia - Plan: Lipid panel  Atherosclerosis of aorta (H[facility])  Acute low back pain without sciatica, unspecified back pain laterality  Hypertension-blood pressure well managed.  Continue on Edarbylor.  Continue with low-sodium diet and exercise. Prediabetes- Hgb A1c 6.0% today.  No medications needed.  Continue with healthy diet and exercise Asthma-well  managed.  Rarely needs pro-air.  Treat allergies as needed. Osteopenia- taking supplemental calcium and vitamin D. Advised to not take more than 500 mg of calcium at one time. continue vitamin D and weight bearing exercises.  Low back pain that is intermittent and moves from one side to the other does not concern me.  The pain is brief when it is present and she does not require medication.  Is not altering her ADLs.  She was concerned that this could be related to her gallbladder and I reassured her that this was not the case.  She will let me know if the pain worsens or becomes more frequent. We discussed her risk factors for heart disease including atherosclerosis found on CT. discussed the importance of keeping blood sugars, blood pressure and cholesterol under good control. She is aware that we are monitoring her kidneys for CKD. Appears to be up-to-date on health maintenance. Declines immunizations Follow-up pending labs or in 4 months for AWV and CPE.  Spent 40 minutes face to face with patient and at least 50% was in counseling and coordination of care. Counseling done in regards to diet, exercise, risk factors for heart disease, low back pain, GERD, osteopenia.

## 2018-05-05 NOTE — Patient Instructions (Addendum)
It was a pleasure seeing you today. Your hemoglobin A1c is 6.0% today and your blood sugars are well controlled.  Continue eating a low-carb low sugar diet and exercising. Your BP is 120/80 and hypertension appears to be well controlled.   Continue on your current medications.   We will call you with your lab results.   I will see you back in February or March for your physical and medicare annual wellness visit.

## 2018-05-06 ENCOUNTER — Telehealth: Payer: Self-pay | Admitting: Family Medicine

## 2018-05-06 LAB — CBC WITH DIFFERENTIAL/PLATELET
BASOS ABS: 0 10*3/uL (ref 0.0–0.2)
Basos: 1 %
EOS (ABSOLUTE): 0.2 10*3/uL (ref 0.0–0.4)
Eos: 5 %
Hematocrit: 39.8 % (ref 34.0–46.6)
Hemoglobin: 13.3 g/dL (ref 11.1–15.9)
IMMATURE GRANS (ABS): 0 10*3/uL (ref 0.0–0.1)
IMMATURE GRANULOCYTES: 0 %
LYMPHS: 50 %
Lymphocytes Absolute: 2.2 10*3/uL (ref 0.7–3.1)
MCH: 30.8 pg (ref 26.6–33.0)
MCHC: 33.4 g/dL (ref 31.5–35.7)
MCV: 92 fL (ref 79–97)
MONOS ABS: 0.4 10*3/uL (ref 0.1–0.9)
Monocytes: 9 %
Neutrophils Absolute: 1.6 10*3/uL (ref 1.4–7.0)
Neutrophils: 35 %
Platelets: 292 10*3/uL (ref 150–450)
RBC: 4.32 x10E6/uL (ref 3.77–5.28)
RDW: 13.6 % (ref 12.3–15.4)
WBC: 4.4 10*3/uL (ref 3.4–10.8)

## 2018-05-06 LAB — COMPREHENSIVE METABOLIC PANEL
ALT: 16 IU/L (ref 0–32)
AST: 17 IU/L (ref 0–40)
Albumin/Globulin Ratio: 1.8 (ref 1.2–2.2)
Albumin: 4.4 g/dL (ref 3.5–4.8)
Alkaline Phosphatase: 54 IU/L (ref 39–117)
BUN/Creatinine Ratio: 18 (ref 12–28)
BUN: 25 mg/dL (ref 8–27)
Bilirubin Total: 0.4 mg/dL (ref 0.0–1.2)
CHLORIDE: 98 mmol/L (ref 96–106)
CO2: 31 mmol/L — ABNORMAL HIGH (ref 20–29)
Calcium: 10.1 mg/dL (ref 8.7–10.3)
Creatinine, Ser: 1.38 mg/dL — ABNORMAL HIGH (ref 0.57–1.00)
GFR calc Af Amer: 45 mL/min/{1.73_m2} — ABNORMAL LOW (ref >59)
GFR calc non Af Amer: 39 mL/min/{1.73_m2} — ABNORMAL LOW (ref >59)
GLUCOSE: 100 mg/dL — AB (ref 65–99)
Globulin, Total: 2.4 g/dL (ref 1.5–4.5)
Potassium: 3.5 mmol/L (ref 3.5–5.2)
Sodium: 144 mmol/L (ref 134–144)
Total Protein: 6.8 g/dL (ref 6.0–8.5)

## 2018-05-06 LAB — LIPID PANEL
Chol/HDL Ratio: 2.6 ratio (ref 0.0–4.4)
Cholesterol, Total: 184 mg/dL (ref 100–199)
HDL: 70 mg/dL (ref >39)
LDL Calculated: 85 mg/dL (ref 0–99)
TRIGLYCERIDES: 147 mg/dL (ref 0–149)
VLDL Cholesterol Cal: 29 mg/dL (ref 5–40)

## 2018-05-06 LAB — T4, FREE: FREE T4: 1.02 ng/dL (ref 0.82–1.77)

## 2018-05-06 LAB — TSH: TSH: 3.11 u[IU]/mL (ref 0.450–4.500)

## 2018-05-06 NOTE — Telephone Encounter (Signed)
  Patient returned Kelly Mccann's call re lab results Advised patient of lab results per Vickie's notes and scheduled her for follow up in 2 months

## 2018-05-17 ENCOUNTER — Telehealth: Payer: Self-pay | Admitting: Family Medicine

## 2018-05-17 MED ORDER — PROAIR HFA 108 (90 BASE) MCG/ACT IN AERS: 2.0000 | INHALATION_SPRAY | RESPIRATORY_TRACT | 0 refills | Status: DC | PRN

## 2018-05-17 NOTE — Telephone Encounter (Signed)
Please take care of this.  

## 2018-05-17 NOTE — Addendum Note (Signed)
Addended by: Minette Headland A on: 05/17/2018 04:58 PM   Modules accepted: Orders

## 2018-05-17 NOTE — Telephone Encounter (Signed)
done

## 2018-05-17 NOTE — Telephone Encounter (Signed)
Refill request come in from pharmacy for [place] needs to go to [place] [place]

## 2018-05-31 ENCOUNTER — Encounter: Payer: Self-pay | Admitting: Internal Medicine

## 2018-05-31 ENCOUNTER — Other Ambulatory Visit: Payer: Self-pay | Admitting: Family Medicine

## 2018-05-31 DIAGNOSIS — I10 Essential (primary) hypertension: Secondary | ICD-10-CM

## 2018-06-20 ENCOUNTER — Other Ambulatory Visit: Payer: Self-pay | Admitting: Family Medicine

## 2018-06-21 ENCOUNTER — Other Ambulatory Visit: Payer: Self-pay

## 2018-06-21 MED ORDER — LOVASTATIN 20 MG PO TABS: 20.0000 mg | ORAL_TABLET | Freq: Every day | ORAL | 1 refills | Status: DC

## 2018-06-21 NOTE — Telephone Encounter (Signed)
[facility] has fax refill request for the pending medication

## 2018-07-07 NOTE — Progress Notes (Signed)
   Subjective:    Patient ID: Kelly Mccann, female    DOB: 1945-08-06, 72 y.o.   MRN: [id number]  HPI Chief Complaint  Patient presents with  . 2 month follow-up    2 month follow-up on kidney function   She is here to follow up on elevated serum creatinine and decreased GFR at her visit in October.   HTN- BP has not been checked at home. Reports daily compliance with medication and no side effects.   Complains of intermittent left sided low back pain. Pain was worse on Thanksgiving day after standing and cooking all day. Pain has improved significantly. Pain is non radiating. No numbness, tingling or weakness. No loss of control of bowels or bladder.   Denies fever, chills, unexplained weight loss, fatigue, dizziness, chest pain, palpitations, shortness of breath, cough, abdominal pain, N/V/D, urinary symptoms, vaginal discharge.   Reviewed allergies, medications, past medical, surgical, family, and social history.   Review of Systems  All other systems reviewed and are negative.       Objective:   Physical Exam BP 110/70   Pulse 74   Wt 205 lb (93 kg)   SpO2 98%   BMI 33.09 kg/m  Alert and oriented and in no acute distress.  Respirations unlabored.  Normal sensation and motion of her back.  Normal gait.  Skin is warm and dry     Assessment & Plan:  Chronic kidney disease, unspecified CKD stage - Plan: Comprehensive metabolic panel  Essential hypertension - Plan: Comprehensive metabolic panel  Left-sided low back pain without sciatica, unspecified chronicity  She is aware that she has some mild CKD and that we are monitoring this. Hypertension is well controlled on current medication.  She will continue on these.  Discussed low-sodium diet. Left-sided low back pain is intermittent with last flareup on Thanksgiving day.  Discussed sending her for an x-ray or physical therapy and she declines for now.  No red flag symptoms.  She will let me know if this becomes more  frequent or if she has any new or worsening symptoms. Follow-up pending labs or for her annual wellness visit.  She will be due for colonoscopy next year as well as DEXA Needs AWV

## 2018-07-08 ENCOUNTER — Encounter: Payer: Self-pay | Admitting: Family Medicine

## 2018-07-08 ENCOUNTER — Ambulatory Visit: Payer: Medicare Other | Admitting: Family Medicine

## 2018-07-08 VITALS — BP 110/70 | HR 74 | Wt 205.0 lb

## 2018-07-08 DIAGNOSIS — M545 Low back pain, unspecified: Secondary | ICD-10-CM

## 2018-07-08 DIAGNOSIS — N189 Chronic kidney disease, unspecified: Secondary | ICD-10-CM

## 2018-07-08 DIAGNOSIS — I10 Essential (primary) hypertension: Secondary | ICD-10-CM

## 2018-07-08 LAB — COMPREHENSIVE METABOLIC PANEL
ALT: 22 IU/L (ref 0–32)
AST: 22 IU/L (ref 0–40)
Albumin/Globulin Ratio: 2 (ref 1.2–2.2)
Albumin: 4.5 g/dL (ref 3.5–4.8)
Alkaline Phosphatase: 64 IU/L (ref 39–117)
BUN/Creatinine Ratio: 17 (ref 12–28)
BUN: 18 mg/dL (ref 8–27)
Bilirubin Total: 0.3 mg/dL (ref 0.0–1.2)
CO2: 28 mmol/L (ref 20–29)
CREATININE: 1.09 mg/dL — AB (ref 0.57–1.00)
Calcium: 9.7 mg/dL (ref 8.7–10.3)
Chloride: 99 mmol/L (ref 96–106)
GFR, EST AFRICAN AMERICAN: 59 mL/min/{1.73_m2} — AB (ref >59)
GFR, EST NON AFRICAN AMERICAN: 52 mL/min/{1.73_m2} — AB (ref >59)
GLOBULIN, TOTAL: 2.2 g/dL (ref 1.5–4.5)
GLUCOSE: 98 mg/dL (ref 65–99)
Potassium: 3.9 mmol/L (ref 3.5–5.2)
SODIUM: 141 mmol/L (ref 134–144)
TOTAL PROTEIN: 6.7 g/dL (ref 6.0–8.5)

## 2018-07-08 NOTE — Patient Instructions (Signed)

## 2018-08-21 ENCOUNTER — Other Ambulatory Visit: Payer: Self-pay | Admitting: Family Medicine

## 2018-08-21 DIAGNOSIS — I10 Essential (primary) hypertension: Secondary | ICD-10-CM

## 2018-09-08 ENCOUNTER — Ambulatory Visit: Payer: Medicare Other | Admitting: Family Medicine

## 2018-10-05 NOTE — Patient Instructions (Signed)
Preventive Care 71 Years and Older, Female Preventive care refers to lifestyle choices and visits with your health care provider that can promote health and wellness. What does preventive care include?  A yearly physical exam. This is also called an annual well check.  Dental exams once or twice a year.  Routine eye exams. Ask your health care provider how often you should have your eyes checked.  Personal lifestyle choices, including: ? Daily care of your teeth and gums. ? Regular physical activity. ? Eating a healthy diet. ? Avoiding tobacco and drug use. ? Limiting alcohol use. ? Practicing safe sex. ? Taking low-dose aspirin every day. ? Taking vitamin and mineral supplements as recommended by your health care provider. What happens during an annual well check? The services and screenings done by your health care provider during your annual well check will depend on your age, overall health, lifestyle risk factors, and family history of disease. Counseling Your health care provider may ask you questions about your:  Alcohol use.  Tobacco use.  Drug use.  Emotional well-being.  Home and relationship well-being.  Sexual activity.  Eating habits.  History of falls.  Memory and ability to understand (cognition).  Work and work Statistician.  Reproductive health.  Screening You may have the following tests or measurements:  Height, weight, and BMI.  Blood pressure.  Lipid and cholesterol levels. These may be checked every 5 years, or more frequently if you are over 30 years old.  Skin check.  Lung cancer screening. You may have this screening every year starting at age 27 if you have a 30-pack-year history of smoking and currently smoke or have quit within the past 15 years.  Colorectal cancer screening. All adults should have this screening starting at age 33 and continuing until age 46. You will have tests every 1-10 years, depending on your results and the  type of screening test. People at increased risk should start screening at an earlier age. Screening tests may include: ? Guaiac-based fecal occult blood testing. ? Fecal immunochemical test (FIT). ? Stool DNA test. ? Virtual colonoscopy. ? Sigmoidoscopy. During this test, a flexible tube with a tiny camera (sigmoidoscope) is used to examine your rectum and lower colon. The sigmoidoscope is inserted through your anus into your rectum and lower colon. ? Colonoscopy. During this test, a long, thin, flexible tube with a tiny camera (colonoscope) is used to examine your entire colon and rectum.  Hepatitis C blood test.  Hepatitis B blood test.  Sexually transmitted disease (STD) testing.  Diabetes screening. This is done by checking your blood sugar (glucose) after you have not eaten for a while (fasting). You may have this done every 1-3 years.  Bone density scan. This is done to screen for osteoporosis. You may have this done starting at age 37.  Mammogram. This may be done every 1-2 years. Talk to your health care provider about how often you should have regular mammograms. Talk with your health care provider about your test results, treatment options, and if necessary, the need for more tests. Vaccines Your health care provider may recommend certain vaccines, such as:  Influenza vaccine. This is recommended every year.  Tetanus, diphtheria, and acellular pertussis (Tdap, Td) vaccine. You may need a Td booster every 10 years.  Varicella vaccine. You may need this if you have not been vaccinated.  Zoster vaccine. You may need this after age 38.  Measles, mumps, and rubella (MMR) vaccine. You may need at least  one dose of MMR if you were born in 1957 or later. You may also need a second dose.  Pneumococcal 13-valent conjugate (PCV13) vaccine. One dose is recommended after age 24.  Pneumococcal polysaccharide (PPSV23) vaccine. One dose is recommended after age 24.  Meningococcal  vaccine. You may need this if you have certain conditions.  Hepatitis A vaccine. You may need this if you have certain conditions or if you travel or work in places where you may be exposed to hepatitis A.  Hepatitis B vaccine. You may need this if you have certain conditions or if you travel or work in places where you may be exposed to hepatitis B.  Haemophilus influenzae type b (Hib) vaccine. You may need this if you have certain conditions. Talk to your health care provider about which screenings and vaccines you need and how often you need them. This information is not intended to replace advice given to you by your health care provider. Make sure you discuss any questions you have with your health care provider. Document Released: 08/16/2015 Document Revised: 09/09/2017 Document Reviewed: 05/21/2015 Elsevier Interactive Patient Education  2019 [employer].

## 2018-10-05 NOTE — Progress Notes (Signed)
Kelly Mccann is a 73 y.o. female who presents for annual wellness visit, CPE and follow-up on chronic medical conditions.  She has the following concerns:  History of prediabetes. Reports eating a healthier diet and being more active.  No asthma flares. Using Symbicort daily. Has not needed albuterol.  Treating allergies.   BP- daily medications,  no concerns Taking lovastatin without any issues.    Immunization History  Administered Date(s) Administered  . Pneumococcal Conjugate-13 12/03/2016   Last Pap smear: hysterectomy Last mammogram:  2018  Last colonoscopy: 2017 and due for a 3 year recall later in 2020  Last DEXA: 2018 and declines repeat DEXA  Dentist: has dentures Ophtho: Dr. [name] Exercise: walking alot  Other doctors caring for patient include: Dr. [name]- dentist  She declines immunizations. Declines labs.    Depression screen:  See questionnaire below.  Depression screen [facility] 2/9 10/06/2018 12/24/2016  Decreased Interest 1 0  Down, Depressed, Hopeless 0 0  PHQ - 2 Score 1 0    Fall Risk Screen: see questionnaire below. Fall Risk  10/06/2018 12/24/2016  Falls in the past year? 0 No  Number falls in past yr: 0 -  Injury with Fall? 0 -    ADL screen:  See questionnaire below Functional Status Survey: Is the patient deaf or have difficulty hearing?: No Does the patient have difficulty seeing, even when wearing glasses/contacts?: No Does the patient have difficulty concentrating, remembering, or making decisions?: No Does the patient have difficulty walking or climbing stairs?: Yes(arthitis in knees) Does the patient have difficulty dressing or bathing?: No Does the patient have difficulty doing errands alone such as visiting a doctor's office or shopping?: No   End of Life Discussion:  Patient does not have a living will and medical power of attorney. MOST form filled out with patient. Advance directives given to patient and discussed.   Review of  Systems Constitutional: -fever, -chills, -sweats, -unexpected weight change, -anorexia, -fatigue Allergy: -sneezing, -itching, -congestion Dermatology: denies changing moles, rash, lumps, new worrisome lesions ENT: -runny nose, -ear pain, -sore throat, -hoarseness, -sinus pain, -teeth pain, -tinnitus, -hearing loss, -epistaxis Cardiology:  -chest pain, -palpitations, -edema, -orthopnea, -paroxysmal nocturnal dyspnea Respiratory: -cough, -shortness of breath, -dyspnea on exertion, -wheezing, -hemoptysis Gastroenterology: -abdominal pain, -nausea, -vomiting, -diarrhea, -constipation, -blood in stool, -changes in bowel movement, -dysphagia Hematology: -bleeding or bruising problems Musculoskeletal: -arthralgias, -myalgias, -joint swelling, -back pain, -neck pain, -cramping, -gait changes Ophthalmology: -vision changes, -eye redness, -itching, -discharge Urology: -dysuria, -difficulty urinating, -hematuria, -urinary frequency, -urgency, incontinence Neurology: -headache, -weakness, -tingling, -numbness, -speech abnormality, -memory loss, -falls, -dizziness Psychology:  -depressed mood, -agitation, -sleep problems    PHYSICAL EXAM:  BP 120/80   Pulse 96   Ht 5\' 5"  (1.651 m)   Wt 207 lb 3.2 oz (94 kg)   SpO2 99%   BMI 34.48 kg/m   General Appearance: Alert, cooperative, no distress, appears stated age Head: Normocephalic, without obvious abnormality, atraumatic Eyes: PERRL, conjunctiva/corneas clear, EOM's intact, fundi benign Ears: Normal TM's and external ear canals Nose: Nares normal, mucosa normal, no drainage or sinus   tenderness Throat: Lips, mucosa, and tongue normal; teeth and gums normal Neck: Supple, no lymphadenopathy; thyroid: no enlargement/tenderness/nodules; no carotid bruit or JVD Back: Spine nontender, no curvature, ROM normal, no CVA tenderness Lungs: Clear to auscultation bilaterally without wheezes, rales or ronchi; respirations unlabored Chest Wall: No tenderness  or deformity Heart: Regular rate and rhythm, S1 and S2 normal, no murmur, rub or gallop Breast Exam:  refuses. Mammogram ordered  Abdomen: Soft, non-tender, nondistended, normoactive bowel sounds, no masses, no hepatosplenomegaly Genitalia: refuses  Extremities: No clubbing, cyanosis or edema Pulses: 2+ and symmetric all extremities Skin: Skin color, texture, turgor normal, no rashes or lesions Lymph nodes: Cervical, supraclavicular, and axillary nodes normal Neurologic: CNII-XII intact, normal strength, sensation and gait; reflexes 2+ and symmetric throughout Psych: Normal mood, affect, hygiene and grooming.  ASSESSMENT/PLAN: Medicare annual wellness visit, subsequent - Plan: POCT Urinalysis DIP (Proadvantage Device)  Routine general medical examination at a health care facility  Essential hypertension  Mild persistent asthma without complication  Hypercholesterolemia  Prediabetes - Plan: HgB A1c  Osteopenia determined by x-ray  Obesity (BMI 30.0-34.9)  Atherosclerosis of aorta (HCC)  Screening for breast cancer - Plan: MM DIGITAL SCREENING BILATERAL  Hematuria, unspecified type - Plan: Urine Microscopic  Immunization refused  Advance directive in chart  Asthma- no flares. Continue daily Symbicort.  Continue with allergy treatment.  Prediabetes- Hgb A1c 5.5 and normal. Continue healthy diet and exercise.  HTN- BP in goal range. Continue on current medication and low sodium diet.  Continue daily statin. No side effects.  Osteopenia- discussed getting adequate calcium, vitamin D supplement. Declines repeat DEXA.  Trace of blood in urine. Sent for microscopy Refuses all immunizations.  Declines labs.  Will be due for colonoscopy later this year, she is aware Call and schedule mammogram.  Advance directive counseling done.  F/u 6 months.     Discussed monthly self breast exams and yearly mammograms; at least 30 minutes of aerobic activity at least 5 days/week and  weight-bearing exercise 2x/week; proper sunscreen use reviewed; healthy diet, including goals of calcium and vitamin D intake and alcohol recommendations (less than or equal to 1 drink/day) reviewed; regular seatbelt use; changing batteries in smoke detectors.  Immunization recommendations discussed.  Colonoscopy recommendations reviewed   Medicare Attestation I have personally reviewed: The patient's medical and social history Their use of alcohol, tobacco or illicit drugs Their current medications and supplements The patient's functional ability including ADLs,fall risks, home safety risks, cognitive, and hearing and visual impairment Diet and physical activities Evidence for depression or mood disorders  The patient's weight, height, and BMI have been recorded in the chart.  I have made referrals, counseling, and provided education to the patient based on review of the above and I have provided the patient with a written personalized care plan for preventive services.     Harland Dingwall, NP-C   10/06/2018

## 2018-10-06 ENCOUNTER — Ambulatory Visit (INDEPENDENT_AMBULATORY_CARE_PROVIDER_SITE_OTHER): Payer: Medicare Other | Admitting: Family Medicine

## 2018-10-06 ENCOUNTER — Encounter: Payer: Self-pay | Admitting: Family Medicine

## 2018-10-06 VITALS — BP 120/80 | HR 96 | Ht 65.0 in | Wt 207.2 lb

## 2018-10-06 DIAGNOSIS — I10 Essential (primary) hypertension: Secondary | ICD-10-CM

## 2018-10-06 DIAGNOSIS — E7800 Pure hypercholesterolemia, unspecified: Secondary | ICD-10-CM | POA: Diagnosis not present

## 2018-10-06 DIAGNOSIS — Z789 Other specified health status: Secondary | ICD-10-CM

## 2018-10-06 DIAGNOSIS — E66811 Obesity, class 1: Secondary | ICD-10-CM

## 2018-10-06 DIAGNOSIS — Z2821 Immunization not carried out because of patient refusal: Secondary | ICD-10-CM | POA: Insufficient documentation

## 2018-10-06 DIAGNOSIS — R7303 Prediabetes: Secondary | ICD-10-CM

## 2018-10-06 DIAGNOSIS — R319 Hematuria, unspecified: Secondary | ICD-10-CM

## 2018-10-06 DIAGNOSIS — E669 Obesity, unspecified: Secondary | ICD-10-CM

## 2018-10-06 DIAGNOSIS — Z0000 Encounter for general adult medical examination without abnormal findings: Secondary | ICD-10-CM | POA: Diagnosis not present

## 2018-10-06 DIAGNOSIS — J4530 Mild persistent asthma, uncomplicated: Secondary | ICD-10-CM | POA: Diagnosis not present

## 2018-10-06 DIAGNOSIS — M8580 Other specified disorders of bone density and structure, unspecified site: Secondary | ICD-10-CM

## 2018-10-06 DIAGNOSIS — I700 Atherosclerosis of aorta: Secondary | ICD-10-CM

## 2018-10-06 DIAGNOSIS — Z1239 Encounter for other screening for malignant neoplasm of breast: Secondary | ICD-10-CM | POA: Diagnosis not present

## 2018-10-06 LAB — POCT URINALYSIS DIP (PROADVANTAGE DEVICE)
Bilirubin, UA: NEGATIVE
GLUCOSE UA: NEGATIVE mg/dL
Ketones, POC UA: NEGATIVE mg/dL
Leukocytes, UA: NEGATIVE
NITRITE UA: NEGATIVE
PROTEIN UA: NEGATIVE mg/dL
Specific Gravity, Urine: 1.015
UUROB: NEGATIVE
pH, UA: 8.5 — AB (ref 5.0–8.0)

## 2018-10-06 LAB — POCT GLYCOSYLATED HEMOGLOBIN (HGB A1C): Hemoglobin A1C: 5.5 % (ref 4.0–5.6)

## 2018-10-07 LAB — URINALYSIS, MICROSCOPIC ONLY
BACTERIA UA: NONE SEEN
CASTS: NONE SEEN /LPF

## 2019-01-09 ENCOUNTER — Other Ambulatory Visit: Payer: Self-pay | Admitting: Family Medicine

## 2019-01-10 NOTE — Telephone Encounter (Signed)
Pt was notified that she needs to use her symbicort again and if she starts to use proair more than twice a month then she needs to notify us. Pt was aware and ok with this. I advised I would refill this for her

## 2019-01-10 NOTE — Telephone Encounter (Signed)
Please see how often she is needing her rescue inhaler. If she is needing it more than 2 times per month then she should follow up with her pulmonologist Dr. Melvyn Novas. Ok to refill today but no additional refills until we can figure out how she is doing.

## 2019-01-10 NOTE — Telephone Encounter (Signed)
Pt states she has not used her symibcort inhaler in a week cause it causes her a stiff neck. Pt states she uses proair twice a day and has helped a lot with her breathing. Please advise if pt needs to go to pulmonologist about this

## 2019-01-10 NOTE — Telephone Encounter (Signed)
She is using her rescue inhaler too often. She needs to follow up with her pulmonologist.

## 2019-01-10 NOTE — Telephone Encounter (Signed)
Is this okay to refill? 

## 2019-01-10 NOTE — Telephone Encounter (Signed)
Pt states she will stop using proair and go back to symbicort inhaler cause she does not want to go back to pulmonologist

## 2019-01-10 NOTE — Telephone Encounter (Signed)
lerft message for pt to call me back

## 2019-01-10 NOTE — Telephone Encounter (Signed)
Ok. Let me know if she is still needing her albuterol more than twice a month. Ok to refill her inhalers if needed.

## 2019-02-27 ENCOUNTER — Other Ambulatory Visit: Payer: Self-pay | Admitting: Family Medicine

## 2019-02-27 DIAGNOSIS — I10 Essential (primary) hypertension: Secondary | ICD-10-CM

## 2019-04-05 ENCOUNTER — Encounter: Payer: Self-pay | Admitting: Family Medicine

## 2019-04-05 ENCOUNTER — Ambulatory Visit
Admission: RE | Admit: 2019-04-05 | Discharge: 2019-04-05 | Disposition: A | Discharge status: Home / Self Care | Payer: Medicare Other | Source: Ambulatory Visit | Attending: Family Medicine | Admitting: Family Medicine

## 2019-04-05 ENCOUNTER — Other Ambulatory Visit: Payer: Self-pay

## 2019-04-05 ENCOUNTER — Ambulatory Visit (INDEPENDENT_AMBULATORY_CARE_PROVIDER_SITE_OTHER): Payer: Medicare Other | Admitting: Family Medicine

## 2019-04-05 VITALS — BP 130/80 | HR 88 | Temp 97.5°F | Resp 20 | Wt 206.8 lb

## 2019-04-05 DIAGNOSIS — Z2820 Immunization not carried out because of patient decision for unspecified reason: Secondary | ICD-10-CM

## 2019-04-05 DIAGNOSIS — E7800 Pure hypercholesterolemia, unspecified: Secondary | ICD-10-CM

## 2019-04-05 DIAGNOSIS — N189 Chronic kidney disease, unspecified: Secondary | ICD-10-CM | POA: Diagnosis not present

## 2019-04-05 DIAGNOSIS — J4541 Moderate persistent asthma with (acute) exacerbation: Secondary | ICD-10-CM

## 2019-04-05 DIAGNOSIS — R062 Wheezing: Secondary | ICD-10-CM

## 2019-04-05 DIAGNOSIS — M8580 Other specified disorders of bone density and structure, unspecified site: Secondary | ICD-10-CM

## 2019-04-05 DIAGNOSIS — Z5320 Procedure and treatment not carried out because of patient's decision for unspecified reasons: Secondary | ICD-10-CM | POA: Insufficient documentation

## 2019-04-05 DIAGNOSIS — R0602 Shortness of breath: Secondary | ICD-10-CM | POA: Diagnosis not present

## 2019-04-05 DIAGNOSIS — I10 Essential (primary) hypertension: Secondary | ICD-10-CM

## 2019-04-05 MED ORDER — BREO ELLIPTA 200-25 MCG/INH IN AEPB: 1.0000 | INHALATION_SPRAY | Freq: Once | RESPIRATORY_TRACT | 0 refills | Status: AC

## 2019-04-05 MED ORDER — PREDNISONE 10 MG (21) PO TBPK: ORAL_TABLET | Freq: Every day | ORAL | 0 refills | Status: DC

## 2019-04-05 NOTE — Progress Notes (Signed)
Subjective:    Patient ID: Kelly Mccann, female    DOB: Sep 26, 1945, 73 y.o.   MRN: [id number]  HPI Chief Complaint  Patient presents with  . med check    med check. declines flu shot, symbicort was not helping, having to use son's nebulizer   Here to follow up on chronic health conditions. I could hear her wheezing as soon as I walked in the room although she reports feeling much better today with her breathing then over the past couple weeks.  Asthma-she has been under the care of Dr. Melvyn Novas, pulmonologist in the past.  States she does not plan on going back to see a specialist.  She did not follow-up.  States she stopped symbicort. States it gave her "neck pain". States she has been using her son's nebulizer and this has given her the most relief. Using her Proair daily.  Initially refused oral steroids, new inhaler or chest XR, new inhaler and stated she is fine with her current treatment.   HTN- checks BP at home and her BP is normal.  Taking medications daily and no issues.   CKD- monitoring.   Osteopenia- due for DEXA. States she is not willing to have a repeat bone density study. Mammogram is overdue and she refuses this.  Due for colonoscopy this year and states she is not going to have this done.  Hyperlipidemia- taking statin daily.   Denies fever, chills, dizziness, chest pain, palpitations, shortness of breath, cough, abdominal pain, N/V/D, urinary symptoms, LE edema.   Reviewed allergies, medications, past medical, surgical, family, and social history.    Review of Systems Pertinent positives and negatives in the history of present illness.     Objective:   Physical Exam Constitutional:      General: She is not in acute distress.    Appearance: Normal appearance. She is not ill-appearing.  Neck:     Musculoskeletal: Normal range of motion.  Cardiovascular:     Rate and Rhythm: Normal rate and regular rhythm.     Pulses: Normal pulses.  Pulmonary:   Effort: Pulmonary effort is normal.     Breath sounds: Wheezing present.  Skin:    General: Skin is warm and dry.     Capillary Refill: Capillary refill takes less than 2 seconds.  Neurological:     General: No focal deficit present.     Mental Status: She is alert and oriented to person, place, and time.     Gait: Gait normal.  Psychiatric:        Mood and Affect: Mood normal.        Thought Content: Thought content normal.        Judgment: Judgment normal.    BP 130/80   Pulse 88   Temp (!) 97.5 F (36.4 C)   Resp 20   Wt 206 lb 12.8 oz (93.8 kg)   SpO2 95%   BMI 34.41 kg/m         Assessment & Plan:  Essential hypertension - Plan: CBC with Differential/Platelet, Comprehensive metabolic panel-blood pressure is at goal range today.  Continue on current medication regimen.  Chronic kidney disease, unspecified CKD stage-discussed keeping good control of blood pressure in order to prevent worsening renal disease.  Check labs in follow-up  Osteopenia determined by x-ray-refuses repeat DEXA.  Hypercholesterolemia - Plan: Lipid panel.  Reports good daily compliance with medication and no side effects.  Check labs in follow-up  Moderate persistent asthma with acute exacerbation -  Plan: predniSONE (STERAPRED UNI-PAK 21 TAB) 10 MG (21) TBPK tablet-discussed that stopping Symbicort and not following up with her pulmonologist or me can potentially be harmful to her health.  She reportedly has been struggling with her asthma over the past few weeks and started using her son's nebulizer.  She is also been using her albuterol inhaler once daily.  We will get a chest x-ray and start her on oral steroids.  Breo sample was given.  Discussed the regimen she has been using is only short-term treatment and is not intended for long-term treatment of asthma and may have potential detrimental side effects.  She refuses to go back to pulmonology.  Wheezing on both sides of chest - Plan: DG Chest 2  View-she is not in any acute distress.  Shortness of breath - Plan: DG Chest 2 View-no acute distress.  Oxygen saturation is normal.  Treat with oral steroids and start her on a Breo inhaler.  She will follow-up in 2 weeks in office or virtually  Refused flu and pneumonia vaccines.  She is also refusing mammogram, bone density and colonoscopy.  I did discuss if she was feeling depressed or why she was ignoring her health and she states it is her preference and she does not want to do these things.  Advised that by not getting her screenings that she could have underlying cancer and not know it.  She said "I except that".

## 2019-04-05 NOTE — Patient Instructions (Addendum)
Go to [facility] imaging after you leave the office to get your chest x-ray.  Use the once daily inhaler, Breo as demonstrated.  Rinse your mouth out after each use.  The goal of this medication is to help you need the rescue inhaler less often.  I am putting you on a tapering dose of steroids to help with your breathing.  Start these today with a full glass of water and food.  You may take them all at one time.  Take the following doses around the same time each day.  Follow-up with me in office or virtually in 2 weeks.  Reminders: You are due for your colonoscopy.  You are also due for your mammogram and bone density test.  If you change your mind about the flu vaccine or the pneumonia vaccine, you can always call and schedule a nurse visit to get these.  We will call you with your results

## 2019-04-06 LAB — COMPREHENSIVE METABOLIC PANEL
ALT: 16 IU/L (ref 0–32)
AST: 23 IU/L (ref 0–40)
Albumin/Globulin Ratio: 1.9 (ref 1.2–2.2)
Albumin: 4.4 g/dL (ref 3.7–4.7)
Alkaline Phosphatase: 62 IU/L (ref 39–117)
BUN/Creatinine Ratio: 13 (ref 12–28)
BUN: 17 mg/dL (ref 8–27)
Bilirubin Total: 0.4 mg/dL (ref 0.0–1.2)
CO2: 29 mmol/L (ref 20–29)
Calcium: 9.8 mg/dL (ref 8.7–10.3)
Chloride: 101 mmol/L (ref 96–106)
Creatinine, Ser: 1.26 mg/dL — ABNORMAL HIGH (ref 0.57–1.00)
GFR calc Af Amer: 50 mL/min/{1.73_m2} — ABNORMAL LOW (ref >59)
GFR calc non Af Amer: 43 mL/min/{1.73_m2} — ABNORMAL LOW (ref >59)
Globulin, Total: 2.3 g/dL (ref 1.5–4.5)
Glucose: 107 mg/dL — ABNORMAL HIGH (ref 65–99)
Potassium: 4.1 mmol/L (ref 3.5–5.2)
Sodium: 143 mmol/L (ref 134–144)
Total Protein: 6.7 g/dL (ref 6.0–8.5)

## 2019-04-06 LAB — CBC WITH DIFFERENTIAL/PLATELET
Basophils Absolute: 0 10*3/uL (ref 0.0–0.2)
Basos: 1 %
EOS (ABSOLUTE): 0.4 10*3/uL (ref 0.0–0.4)
Eos: 10 %
Hematocrit: 40.7 % (ref 34.0–46.6)
Hemoglobin: 13.6 g/dL (ref 11.1–15.9)
Immature Grans (Abs): 0 10*3/uL (ref 0.0–0.1)
Immature Granulocytes: 0 %
Lymphocytes Absolute: 2 10*3/uL (ref 0.7–3.1)
Lymphs: 42 %
MCH: 31.3 pg (ref 26.6–33.0)
MCHC: 33.4 g/dL (ref 31.5–35.7)
MCV: 94 fL (ref 79–97)
Monocytes Absolute: 0.4 10*3/uL (ref 0.1–0.9)
Monocytes: 9 %
Neutrophils Absolute: 1.7 10*3/uL (ref 1.4–7.0)
Neutrophils: 38 %
Platelets: 300 10*3/uL (ref 150–450)
RBC: 4.34 x10E6/uL (ref 3.77–5.28)
RDW: 13.2 % (ref 11.7–15.4)
WBC: 4.6 10*3/uL (ref 3.4–10.8)

## 2019-04-06 LAB — LIPID PANEL
Chol/HDL Ratio: 2.7 ratio (ref 0.0–4.4)
Cholesterol, Total: 199 mg/dL (ref 100–199)
HDL: 73 mg/dL (ref >39)
LDL Chol Calc (NIH): 102 mg/dL — ABNORMAL HIGH (ref 0–99)
Triglycerides: 142 mg/dL (ref 0–149)
VLDL Cholesterol Cal: 24 mg/dL (ref 5–40)

## 2019-04-19 ENCOUNTER — Ambulatory Visit (INDEPENDENT_AMBULATORY_CARE_PROVIDER_SITE_OTHER): Payer: Medicare Other | Admitting: Family Medicine

## 2019-04-19 ENCOUNTER — Encounter: Payer: Self-pay | Admitting: Family Medicine

## 2019-04-19 ENCOUNTER — Other Ambulatory Visit: Payer: Self-pay

## 2019-04-19 VITALS — BP 120/72 | HR 68 | Temp 98.4°F | Resp 16 | Wt 206.4 lb

## 2019-04-19 DIAGNOSIS — I10 Essential (primary) hypertension: Secondary | ICD-10-CM

## 2019-04-19 DIAGNOSIS — J4541 Moderate persistent asthma with (acute) exacerbation: Secondary | ICD-10-CM | POA: Diagnosis not present

## 2019-04-19 MED ORDER — PROAIR HFA 108 (90 BASE) MCG/ACT IN AERS: 2.0000 | INHALATION_SPRAY | Freq: Four times a day (QID) | RESPIRATORY_TRACT | 0 refills | Status: DC | PRN

## 2019-04-19 MED ORDER — BREO ELLIPTA 200-25 MCG/INH IN AEPB: 1.0000 | INHALATION_SPRAY | Freq: Every day | RESPIRATORY_TRACT | 2 refills | Status: DC

## 2019-04-19 NOTE — Patient Instructions (Signed)
Continue using the Breo inhaler daily and rinse after each use.  If you find yourself needing the albuterol more often then let me know.   Continue exercising and build up your tolerance. Keep [place] the good work.

## 2019-04-19 NOTE — Progress Notes (Signed)
   Subjective:    Patient ID: Kelly Mccann, female    DOB: [birth year], 73 y.o.   MRN: [id number]  HPI Chief Complaint  Patient presents with  . follow-up    follow-up on breathing and BP. doing better with breathing   Here to follow up on uncontrolled asthma and HTN.  At her previous visit in early September, she was wheezing and having shortness of breath. Had been using albuterol several times per day and even using her son's nebulizer.  Treated her with a course of steroids. Started her on Breo since she did not like Symbicort.  She reports doing much better.  No longer needing albuterol and has been able to increase her physical activity.  States she has been walking more and has lost 4 pounds.  She has no new concerns today.  She has refused to go back to pulmonology.  Chest XR negative   Denies fever, chills, dizziness, chest pain, palpitations, shortness of breath, abdominal pain, N/V/D, urinary symptoms, LE edema.   Reviewed allergies, medications, past medical, surgical, family, and social history.     Review of Systems Pertinent positives and negatives in the history of present illness.     Objective:   Physical Exam BP 120/72   Pulse 68   Temp 98.4 F (36.9 C)   Resp 16   Wt 206 lb 6.4 oz (93.6 kg)   SpO2 96%   BMI 34.35 kg/m   Alert and in no distress.  Cardiac exam shows a regular sinus rhythm without murmurs or gallops. Lungs are clear to auscultation, no wheezing.  Extremities without edema.       Assessment & Plan:  Moderate persistent asthma with acute exacerbation - Plan: fluticasone furoate-vilanterol (BREO ELLIPTA) 200-25 MCG/INH AEPB, PROAIR HFA 108 (90 Base) MCG/ACT inhaler Seems to be doing well on Breo and asthma better controlled.  She has been able to increase her activity and reports a 4 pound weight loss.  Encouraged her to keep up the good work.  She will let me know if she is needing albuterol inhaler more than twice per week during the  day. Refuses flu and pneumonia vaccines. Refuses follow-up with pulmonology Follow-up here in 3 months  Essential hypertension Blood pressure in good range and well-controlled.  Continue on current medication.

## 2019-05-19 ENCOUNTER — Encounter: Payer: Self-pay | Admitting: Internal Medicine

## 2019-05-31 ENCOUNTER — Other Ambulatory Visit: Payer: Self-pay | Admitting: Family Medicine

## 2019-05-31 DIAGNOSIS — I10 Essential (primary) hypertension: Secondary | ICD-10-CM

## 2019-06-15 ENCOUNTER — Telehealth: Payer: Self-pay | Admitting: Internal Medicine

## 2019-06-15 NOTE — Telephone Encounter (Signed)
Patient sent my recall letter back stating that she could not schedule another colonoscopy because the last one caused her more than $400 and requested that she be taken off our "mailing list".  I called her and explained that unfortunately the Medicare rules lead to billing for a therapeutic colonoscopy if we see and remove polyps which is medically appropriate.  Her diagnosis was screening and if she had no polyps the colonoscopy would have been free.  We are bound by these billing rules established by the government and Medicare.  She expressed understanding and appreciation for the call.  We have decided to revisit things in 2 years to see if she is in a better situation to repeat a colonoscopy.  She knows that it is a medical recommendation to have one at this time based upon the guidelines but she declines.

## 2019-06-16 NOTE — Telephone Encounter (Signed)
New recall placed.

## 2019-07-18 ENCOUNTER — Other Ambulatory Visit: Payer: Self-pay | Admitting: Family Medicine

## 2019-07-18 DIAGNOSIS — J4541 Moderate persistent asthma with (acute) exacerbation: Secondary | ICD-10-CM

## 2019-07-19 ENCOUNTER — Ambulatory Visit: Payer: Medicare Other | Admitting: Family Medicine

## 2019-07-19 NOTE — Telephone Encounter (Signed)
Is this okay to refill? 

## 2019-08-08 ENCOUNTER — Encounter: Payer: Self-pay | Admitting: Family Medicine

## 2019-08-08 ENCOUNTER — Other Ambulatory Visit: Payer: Self-pay

## 2019-08-08 ENCOUNTER — Ambulatory Visit (INDEPENDENT_AMBULATORY_CARE_PROVIDER_SITE_OTHER): Payer: Medicare Other | Admitting: Family Medicine

## 2019-08-08 VITALS — BP 138/80 | HR 85 | Temp 97.5°F | Resp 16 | Wt 211.6 lb

## 2019-08-08 DIAGNOSIS — M545 Low back pain, unspecified: Secondary | ICD-10-CM

## 2019-08-08 DIAGNOSIS — I10 Essential (primary) hypertension: Secondary | ICD-10-CM | POA: Diagnosis not present

## 2019-08-08 DIAGNOSIS — N189 Chronic kidney disease, unspecified: Secondary | ICD-10-CM | POA: Diagnosis not present

## 2019-08-08 DIAGNOSIS — J4530 Mild persistent asthma, uncomplicated: Secondary | ICD-10-CM

## 2019-08-08 DIAGNOSIS — E7800 Pure hypercholesterolemia, unspecified: Secondary | ICD-10-CM | POA: Diagnosis not present

## 2019-08-08 DIAGNOSIS — J302 Other seasonal allergic rhinitis: Secondary | ICD-10-CM

## 2019-08-08 DIAGNOSIS — I700 Atherosclerosis of aorta: Secondary | ICD-10-CM | POA: Diagnosis not present

## 2019-08-08 DIAGNOSIS — R7303 Prediabetes: Secondary | ICD-10-CM

## 2019-08-08 DIAGNOSIS — Z87891 Personal history of nicotine dependence: Secondary | ICD-10-CM

## 2019-08-08 DIAGNOSIS — Z2820 Immunization not carried out because of patient decision for unspecified reason: Secondary | ICD-10-CM

## 2019-08-08 DIAGNOSIS — G8929 Other chronic pain: Secondary | ICD-10-CM

## 2019-08-08 NOTE — Progress Notes (Signed)
Subjective:    Patient ID: Kelly Mccann, female    DOB: 1945-08-20, 74 y.o.   MRN: [id number]  HPI Chief Complaint  Patient presents with  . 3 month follow-up    3 month follow-up on asthma   Here today to follow up on asthma. Denies any recent flare ups.  She is using daily Breo. Has needed her albuterol inhaler twice within the past 2 weeks. Has not had any nighttime symptoms.  Takes Claritin as needed.  Does not need Flonase.   HTN- checks BP at home and readings have been around 130/80 regularly. Taking Edarbyclor daily without any issues.   Prediabetes- declines Hgb A1c today. States it was normal last time.  States she has been eating [employer] candies.  Denies any diabetes symptoms  Complains of mid-low back mostly on left side.  Pain is intermittent for the past year or year and half a race. This is not getting worse. Pain does not radiate. She notices the pain when standing for long periods. Does not regularly need medication. She has taken an ibuprofen with complete relief. Pain does not wake her up at night.  No unexplained weight loss.  Denies fever, chills, night sweats, dizziness, chest pain, palpitations, shortness of breath, cough, abdominal pain, belching, early satiety, nausea, vomiting, diarrhea or constipation.  No urinary symptoms.  Denies lower extremity edema.  HL- taking lovasatin most days.  She questions whether this could be making her hands swell at times.  No swelling present today Recalls having an issue with other statins. Cannot remember which ones.   Aortic and coronary atherosclerosis per CT in 2017.  Declines EKG for screening, declines cardiology referral.  States she feels fine and if she starts having any cardiac symptoms, she will let me know or go to the emergency room if after hours  Review of Systems Pertinent positives and negatives in the history of present illness.     Objective:   Physical Exam BP 138/80   Pulse 85   Temp (!)  97.5 F (36.4 C)   Resp 16   Wt 211 lb 9.6 oz (96 kg)   SpO2 99%   BMI 35.21 kg/m   Alert and in no distress.  Cardiac exam shows a regular sinus rhythm without murmurs or gallops. Lungs are clear to auscultation.  Abdomen soft, nontender, nondistended.  Extremities without edema       Assessment & Plan:  Mild persistent asthma without complication -Asthma appears to be well controlled with daily Breo.  She is pleased with her current treatment.  Atherosclerosis of aorta (HCC) - Plan: Lipid Panel -Discussed that atherosclerosis is a risk factor for heart disease and that I am unable to tell her how bad this is.  We discussed staying on a statin to keep her cholesterol under good control and the possibility of further cardiovascular work-up.  She declines this for now.  She is asymptomatic.  We discussed red flag symptoms that she should seek immediate medical attention for such as chest pain.  Essential hypertension - Plan: CBC with Differential, Comprehensive metabolic panel -Appears controlled on current medications.  Continue on medication and watch her sodium intake.  Continue being physically active  Hypercholesterolemia - Plan: Lipid Panel -Continue on statin therapy.  We may need to adjust her medication if her LDL is not improved.  Chronic kidney disease, unspecified CKD stage - Plan: CBC with Differential, Comprehensive metabolic panel -Recommend good blood pressure control to prevent any worsening renal  disease.  Follow-up pending results  Prediabetes - Plan: CBC with Differential, Comprehensive metabolic panel -Declines hemoglobin A1c.  Her previous one was normal.  Recommend healthy diet and increase physical activity  Chronic low back pain without sciatica, unspecified back pain laterality -Discussed that her symptoms sound more musculoskeletal than any other explanation.  She declines x-ray, physical therapy or Ortho referral at this time.  She is not requiring  medication for pain management.  She will let me know if her symptoms worsen or if she would like to pursue further evaluation and treatment  Vaccine refused by patient -Declines flu vaccine   Seasonal allergic rhinitis, unspecified trigger -Takes antihistamine as needed  Former smoker, stopped smoking many years ago

## 2019-08-09 LAB — CBC WITH DIFFERENTIAL/PLATELET
Basophils Absolute: 0 10*3/uL (ref 0.0–0.2)
Basos: 1 %
EOS (ABSOLUTE): 0.2 10*3/uL (ref 0.0–0.4)
Eos: 5 %
Hematocrit: 38.3 % (ref 34.0–46.6)
Hemoglobin: 13.1 g/dL (ref 11.1–15.9)
Immature Grans (Abs): 0 10*3/uL (ref 0.0–0.1)
Immature Granulocytes: 0 %
Lymphocytes Absolute: 1.1 10*3/uL (ref 0.7–3.1)
Lymphs: 35 %
MCH: 31.1 pg (ref 26.6–33.0)
MCHC: 34.2 g/dL (ref 31.5–35.7)
MCV: 91 fL (ref 79–97)
Monocytes Absolute: 0.4 10*3/uL (ref 0.1–0.9)
Monocytes: 12 %
Neutrophils Absolute: 1.5 10*3/uL (ref 1.4–7.0)
Neutrophils: 47 %
Platelets: 263 10*3/uL (ref 150–450)
RBC: 4.21 x10E6/uL (ref 3.77–5.28)
RDW: 13.7 % (ref 11.7–15.4)
WBC: 3.2 10*3/uL — ABNORMAL LOW (ref 3.4–10.8)

## 2019-08-09 LAB — COMPREHENSIVE METABOLIC PANEL
ALT: 19 IU/L (ref 0–32)
AST: 23 IU/L (ref 0–40)
Albumin/Globulin Ratio: 2 (ref 1.2–2.2)
Albumin: 4.3 g/dL (ref 3.7–4.7)
Alkaline Phosphatase: 74 IU/L (ref 39–117)
BUN/Creatinine Ratio: 15 (ref 12–28)
BUN: 15 mg/dL (ref 8–27)
Bilirubin Total: 0.3 mg/dL (ref 0.0–1.2)
CO2: 28 mmol/L (ref 20–29)
Calcium: 9.3 mg/dL (ref 8.7–10.3)
Chloride: 99 mmol/L (ref 96–106)
Creatinine, Ser: 0.99 mg/dL (ref 0.57–1.00)
GFR calc Af Amer: 66 mL/min/{1.73_m2} (ref >59)
GFR calc non Af Amer: 58 mL/min/{1.73_m2} — ABNORMAL LOW (ref >59)
Globulin, Total: 2.2 g/dL (ref 1.5–4.5)
Glucose: 106 mg/dL — ABNORMAL HIGH (ref 65–99)
Potassium: 3.8 mmol/L (ref 3.5–5.2)
Sodium: 141 mmol/L (ref 134–144)
Total Protein: 6.5 g/dL (ref 6.0–8.5)

## 2019-08-09 LAB — LIPID PANEL
Chol/HDL Ratio: 2.6 ratio (ref 0.0–4.4)
Cholesterol, Total: 176 mg/dL (ref 100–199)
HDL: 67 mg/dL (ref >39)
LDL Chol Calc (NIH): 92 mg/dL (ref 0–99)
Triglycerides: 96 mg/dL (ref 0–149)
VLDL Cholesterol Cal: 17 mg/dL (ref 5–40)

## 2019-08-09 NOTE — Progress Notes (Signed)
Her labs are fine.  Her kidney function has actually improved.  Her cholesterol has improved slightly but her LDL could be better.  I recommend that she continue paying close attention to limiting fatty foods and fried foods and continue on her current medications.

## 2019-09-01 DIAGNOSIS — Z20822 Contact with and (suspected) exposure to covid-19: Secondary | ICD-10-CM | POA: Diagnosis not present

## 2019-12-05 NOTE — Progress Notes (Signed)
Kelly Mccann is a 74 y.o. female who presents for annual wellness visit, CPE and follow-up on chronic medical conditions.  She has the following concerns:  Some of her medications are not affordable.  States Lexicographer and Ben Bolt are too expensive now.  She has taken Symbicort in the past  Stats she stopped taking her lovastatin because it made her fingers hurt. Stopped it one week ago.   States she does not want to do any preventive cancer screenings. Does not want immunizations.    Immunization History  Administered Date(s) Administered  . Pneumococcal Conjugate-13 12/03/2016   Last Pap smear: hysterectomy declines pelvic today Last mammogram: 2018 Last colonoscopy: 2017  Last DEXA: 12/2016 osteopenia  Dentist: don't see one. Full denture on top and partial on bottom  Ophtho: yearly Exercise: walking  Other doctors caring for patient include: Dr. [name]- eyes Dr. Melvyn Novas- pulmonologist  Dr. Carlean Purl- GI    States her son and grandchild who is age 25 live with her. States her daughter passed away in 09-14-22. States she is managing ok.     Depression screen:  See questionnaire below.  Depression screen [facility] 2/9 12/06/2019 08/08/2019 10/06/2018 12/24/2016  Decreased Interest 0 0 1 0  Down, Depressed, Hopeless 0 0 0 0  PHQ - 2 Score 0 0 1 0    Fall Risk Screen: see questionnaire below. Fall Risk  12/06/2019 08/08/2019 10/06/2018 12/24/2016  Falls in the past year? 0 0 0 No  Number falls in past yr: 0 0 0 -  Injury with Fall? 0 0 0 -    ADL screen:  See questionnaire below Functional Status Survey: Is the patient deaf or have difficulty hearing?: No Does the patient have difficulty seeing, even when wearing glasses/contacts?: No Does the patient have difficulty concentrating, remembering, or making decisions?: No Does the patient have difficulty walking or climbing stairs?: Yes(knees are stiff on stairs) Does the patient have difficulty dressing or bathing?: No Does the patient have  difficulty doing errands alone such as visiting a doctor's office or shopping?: No   End of Life Discussion:  Patient has a living will and medical power of attorney. Her children are her HCPOAs are Kristin Bruins daughter and Sadae Fleetwood her son.   Review of Systems Constitutional: -fever, -chills, -sweats, -unexpected weight change, -anorexia, -fatigue Allergy: -sneezing, -itching, -congestion Dermatology: denies changing moles, rash, lumps, new worrisome lesions ENT: -runny nose, -ear pain, -sore throat, -hoarseness, -sinus pain, -teeth pain, -tinnitus, -hearing loss, -epistaxis Cardiology:  -chest pain, -palpitations, -edema, -orthopnea, -paroxysmal nocturnal dyspnea Respiratory: -cough, -shortness of breath, -dyspnea on exertion, -wheezing, -hemoptysis Gastroenterology: -abdominal pain, -nausea, -vomiting, -diarrhea, -constipation, -blood in stool, -changes in bowel movement, -dysphagia Hematology: -bleeding or bruising problems Musculoskeletal: -arthralgias, -myalgias, -joint swelling, -back pain, -neck pain, -cramping, -gait changes Ophthalmology: -vision changes, -eye redness, -itching, -discharge Urology: -dysuria, -difficulty urinating, -hematuria, -urinary frequency, -urgency, incontinence Neurology: -headache, -weakness, -tingling, -numbness, -speech abnormality, -memory loss, -falls, -dizziness Psychology:  -depressed mood, -agitation, -sleep problems    PHYSICAL EXAM:  BP 120/80   Pulse 74   Temp 97.8 F (36.6 C)   Ht 5' 5.25" (1.657 m)   Wt 208 lb 9.6 oz (94.6 kg)   BMI 34.45 kg/m   General Appearance: Alert, cooperative, no distress, appears stated age Head: Normocephalic, without obvious abnormality, atraumatic Eyes: PERRL, conjunctiva/corneas clear, EOM's intact Ears: Normal TM's and external ear canals Nose: Mask  Throat: Mask  Neck: Supple, no lymphadenopathy; thyroid: no enlargement/tenderness/nodules; no JVD Back:  Spine nontender, no curvature, ROM  normal, no CVA tenderness Lungs: Clear to auscultation bilaterally without wheezes, rales or ronchi; respirations unlabored Chest Wall: No tenderness or deformity Heart: Regular rate and rhythm, S1 and S2 normal, no murmur, rub or gallop Breast Exam: declines  Abdomen: Soft, non-tender, nondistended, normoactive bowel sounds, no masses, no hepatosplenomegaly Genitalia: declines  Extremities: No clubbing, cyanosis or edema Pulses: 2+ and symmetric all extremities Skin: Skin color, texture, turgor normal, no rashes or lesions Lymph nodes: Cervical, supraclavicular, and axillary nodes normal Neurologic: CNII-XII intact, normal strength, sensation and gait Psych: Normal mood, affect, hygiene and grooming.  ASSESSMENT/PLAN: Medicare annual wellness visit, subsequent -Here today for annual wellness visit.  She denies depression, difficulties with ADLs, memory concerns.  No recent falls.  Advanced directive counseling was done.  Routine general medical examination at a health care facility - Plan: CBC with Differential/Platelet, Comprehensive metabolic panel, TSH, T4, free -Here today for fasting CPE.  Preventive health care reviewed and she is overdue with mammogram, DEXA, and Pap smear.  She declines having all of these done.  States she will be due for colonoscopy in 2022 but she does not plan on having this done.  Discussed Cologuard and she refuses that also.  Counseling on healthy lifestyle including diet and exercise.  Discussed safety.  Immunizations refused  Mild persistent asthma without complication - Plan: budesonide-formoterol (SYMBICORT) 160-4.5 MCG/ACT inhaler -Asthma controlled but [facility] is no longer affordable.  I will switch her to Symbicort.  Counseling done on the fibrosis twice daily and she should rinse her mouth out after each use.  She will follow-up if she is needing her rescue inhaler more often  Essential hypertension - Plan: EKG 12-Lead -Controlled.  Continue medication.   Earnest Rosier chlor is no longer affordable.  She states she has at least 1 month of medication at home.  She was provided with 6 weeks of samples today.  Consider splitting the medications and is to prescriptions at her follow-up  Hypercholesterolemia - Plan: Lipid panel, EKG 12-Lead -Counseling on healthy diet and exercise.  I recommend she continue statin therapy.  She does agree to taking lovastatin 3 days a week check lipid panel and follow-up  Hypertriglyceridemia - Plan: Lipid panel, EKG 12-Lead -Counseling on healthy diet and exercise.  I recommend she continue statin therapy.  She does agree to taking lovastatin 3 days a week check lipid panel and follow-up  Osteopenia determined by x-ray - Plan: VITAMIN D 25 Hydroxy (Vit-D Deficiency, Fractures) -She is getting adequate calcium in her diet, taking vitamin D and walking for exercise.  She declines having a repeat DEXA.  Discussed that she could have osteoporosis and not know it and that there is treatment.  States she is aware but she still does not want to have it  Mammogram declined -States she is aware that she could have underlying breast cancer and not know it.  Vitamin D deficiency - Plan: VITAMIN D 25 Hydroxy (Vit-D Deficiency, Fractures) -She is taking a supplement.  Check vitamin D level and adjust dose as appropriate  Atherosclerosis of aorta (HCC) - Plan: EKG 12-Lead -Discussed this diagnosis increases her risk of having an acute coronary or neurological event.  Discussed that statin therapy may help.  She agrees to take her lovastatin 3 days a week for now.  Vaccine refused by patient -She declines pneumonia vaccine or any other vaccines as well  Prediabetes - Plan: Hemoglobin A1c -Counseling on healthy diet and exercise.  Check A1c  and follow-up     Discussed monthly self breast exams and yearly mammograms; at least 30 minutes of aerobic activity at least 5 days/week and weight-bearing exercise 2x/week; proper sunscreen  use reviewed; healthy diet, including goals of calcium and vitamin D intake and alcohol recommendations (less than or equal to 1 drink/day) reviewed; regular seatbelt use; changing batteries in smoke detectors.  Immunization recommendations discussed.  Colonoscopy recommendations reviewed   Medicare Attestation I have personally reviewed: The patient's medical and social history Their use of alcohol, tobacco or illicit drugs Their current medications and supplements The patient's functional ability including ADLs,fall risks, home safety risks, cognitive, and hearing and visual impairment Diet and physical activities Evidence for depression or mood disorders  The patient's weight, height, and BMI have been recorded in the chart.  I have made referrals, counseling, and provided education to the patient based on review of the above and I have provided the patient with a written personalized care plan for preventive services.     Harland Dingwall, NP-C   12/06/2019

## 2019-12-06 ENCOUNTER — Encounter: Payer: Self-pay | Admitting: Family Medicine

## 2019-12-06 ENCOUNTER — Ambulatory Visit (INDEPENDENT_AMBULATORY_CARE_PROVIDER_SITE_OTHER): Payer: Medicare Other | Admitting: Family Medicine

## 2019-12-06 ENCOUNTER — Other Ambulatory Visit: Payer: Self-pay

## 2019-12-06 VITALS — BP 120/80 | HR 74 | Temp 97.8°F | Ht 65.25 in | Wt 208.6 lb

## 2019-12-06 DIAGNOSIS — E7800 Pure hypercholesterolemia, unspecified: Secondary | ICD-10-CM | POA: Diagnosis not present

## 2019-12-06 DIAGNOSIS — Z2820 Immunization not carried out because of patient decision for unspecified reason: Secondary | ICD-10-CM | POA: Diagnosis not present

## 2019-12-06 DIAGNOSIS — Z5320 Procedure and treatment not carried out because of patient's decision for unspecified reasons: Secondary | ICD-10-CM

## 2019-12-06 DIAGNOSIS — Z0000 Encounter for general adult medical examination without abnormal findings: Secondary | ICD-10-CM | POA: Diagnosis not present

## 2019-12-06 DIAGNOSIS — I10 Essential (primary) hypertension: Secondary | ICD-10-CM | POA: Diagnosis not present

## 2019-12-06 DIAGNOSIS — E559 Vitamin D deficiency, unspecified: Secondary | ICD-10-CM | POA: Diagnosis not present

## 2019-12-06 DIAGNOSIS — J4530 Mild persistent asthma, uncomplicated: Secondary | ICD-10-CM

## 2019-12-06 DIAGNOSIS — R7303 Prediabetes: Secondary | ICD-10-CM | POA: Diagnosis not present

## 2019-12-06 DIAGNOSIS — M8580 Other specified disorders of bone density and structure, unspecified site: Secondary | ICD-10-CM | POA: Diagnosis not present

## 2019-12-06 DIAGNOSIS — E781 Pure hyperglyceridemia: Secondary | ICD-10-CM | POA: Diagnosis not present

## 2019-12-06 DIAGNOSIS — I700 Atherosclerosis of aorta: Secondary | ICD-10-CM

## 2019-12-06 MED ORDER — BUDESONIDE-FORMOTEROL FUMARATE 160-4.5 MCG/ACT IN AERO: 2.0000 | INHALATION_SPRAY | Freq: Two times a day (BID) | RESPIRATORY_TRACT | 3 refills | Status: DC

## 2019-12-06 NOTE — Patient Instructions (Addendum)
  Ms. Herro , Thank you for taking time to come for your Medicare Wellness Visit. I appreciate your ongoing commitment to your health goals. Please review the following plan we discussed and let me know if I can assist you in the future.   These are the goals we discussed:  Try taking the lovastatin 3 days per week.   Continue with a healthy diet and exercise.   Continue on your current medications.    This is a list of the screening recommended for you and due dates:  Health Maintenance  Topic Date Due  . COVID-19 Vaccine (1) Never done  . Tetanus Vaccine  Never done  . Pneumonia vaccines (2 of 2 - PPSV23) 12/03/2017  . Mammogram  10/08/2018  . Colon Cancer Screening  04/04/2019  . DEXA scan (bone density measurement)  Completed  .  Hepatitis C: One time screening is recommended by Center for Disease Control  (CDC) for  adults born from [birth year] through 1963.   Completed  . Flu Shot  Discontinued

## 2019-12-07 ENCOUNTER — Other Ambulatory Visit: Payer: Self-pay | Admitting: Family Medicine

## 2019-12-07 LAB — HEMOGLOBIN A1C
Est. average glucose Bld gHb Est-mCnc: 120 mg/dL
Hgb A1c MFr Bld: 5.8 % — ABNORMAL HIGH (ref 4.8–5.6)

## 2019-12-07 LAB — CBC WITH DIFFERENTIAL/PLATELET
Basophils Absolute: 0 10*3/uL (ref 0.0–0.2)
Basos: 1 %
EOS (ABSOLUTE): 0.2 10*3/uL (ref 0.0–0.4)
Eos: 4 %
Hematocrit: 40.5 % (ref 34.0–46.6)
Hemoglobin: 13.4 g/dL (ref 11.1–15.9)
Immature Grans (Abs): 0 10*3/uL (ref 0.0–0.1)
Immature Granulocytes: 1 %
Lymphocytes Absolute: 1.8 10*3/uL (ref 0.7–3.1)
Lymphs: 42 %
MCH: 30.4 pg (ref 26.6–33.0)
MCHC: 33.1 g/dL (ref 31.5–35.7)
MCV: 92 fL (ref 79–97)
Monocytes Absolute: 0.4 10*3/uL (ref 0.1–0.9)
Monocytes: 8 %
Neutrophils Absolute: 2 10*3/uL (ref 1.4–7.0)
Neutrophils: 44 %
Platelets: 314 10*3/uL (ref 150–450)
RBC: 4.41 x10E6/uL (ref 3.77–5.28)
RDW: 14.2 % (ref 11.7–15.4)
WBC: 4.4 10*3/uL (ref 3.4–10.8)

## 2019-12-07 LAB — TSH: TSH: 1.46 u[IU]/mL (ref 0.450–4.500)

## 2019-12-07 LAB — T4, FREE: Free T4: 1.09 ng/dL (ref 0.82–1.77)

## 2019-12-07 LAB — COMPREHENSIVE METABOLIC PANEL
ALT: 14 IU/L (ref 0–32)
AST: 21 IU/L (ref 0–40)
Albumin/Globulin Ratio: 2.1 (ref 1.2–2.2)
Albumin: 4.4 g/dL (ref 3.7–4.7)
Alkaline Phosphatase: 58 IU/L (ref 39–117)
BUN/Creatinine Ratio: 17 (ref 12–28)
BUN: 20 mg/dL (ref 8–27)
Bilirubin Total: 0.4 mg/dL (ref 0.0–1.2)
CO2: 25 mmol/L (ref 20–29)
Calcium: 9.5 mg/dL (ref 8.7–10.3)
Chloride: 102 mmol/L (ref 96–106)
Creatinine, Ser: 1.18 mg/dL — ABNORMAL HIGH (ref 0.57–1.00)
GFR calc Af Amer: 53 mL/min/{1.73_m2} — ABNORMAL LOW (ref >59)
GFR calc non Af Amer: 46 mL/min/{1.73_m2} — ABNORMAL LOW (ref >59)
Globulin, Total: 2.1 g/dL (ref 1.5–4.5)
Glucose: 102 mg/dL — ABNORMAL HIGH (ref 65–99)
Potassium: 4.2 mmol/L (ref 3.5–5.2)
Sodium: 140 mmol/L (ref 134–144)
Total Protein: 6.5 g/dL (ref 6.0–8.5)

## 2019-12-07 LAB — LIPID PANEL
Chol/HDL Ratio: 2.9 ratio (ref 0.0–4.4)
Cholesterol, Total: 227 mg/dL — ABNORMAL HIGH (ref 100–199)
HDL: 79 mg/dL (ref >39)
LDL Chol Calc (NIH): 124 mg/dL — ABNORMAL HIGH (ref 0–99)
Triglycerides: 137 mg/dL (ref 0–149)
VLDL Cholesterol Cal: 24 mg/dL (ref 5–40)

## 2019-12-07 LAB — VITAMIN D 25 HYDROXY (VIT D DEFICIENCY, FRACTURES): Vit D, 25-Hydroxy: 49.5 ng/mL (ref 30.0–100.0)

## 2019-12-07 MED ORDER — LOVASTATIN 20 MG PO TABS: 20.0000 mg | ORAL_TABLET | Freq: Every day | ORAL | 1 refills | Status: DC

## 2019-12-07 NOTE — Progress Notes (Signed)
   Subjective:    Patient ID: Kelly Mccann, female    DOB: [birth year], 74 y.o.   MRN: [id number]  HPI    Review of Systems     Objective:   Physical Exam        Assessment & Plan:

## 2019-12-07 NOTE — Progress Notes (Signed)
She is still in prediabetes range. I recommend she take the lovastatin 3 days per week. Her LDL or bad cholesterol is elevated. I also recommend eating a diet low in fatty foods. Otherwise, her labs are ok and stable.

## 2020-03-09 ENCOUNTER — Other Ambulatory Visit: Payer: Self-pay | Admitting: Family Medicine

## 2020-06-07 ENCOUNTER — Encounter: Payer: Medicare Other | Admitting: Family Medicine

## 2020-06-11 ENCOUNTER — Other Ambulatory Visit: Payer: Self-pay

## 2020-06-11 ENCOUNTER — Telehealth (INDEPENDENT_AMBULATORY_CARE_PROVIDER_SITE_OTHER): Payer: Medicare Other | Admitting: Family Medicine

## 2020-06-11 ENCOUNTER — Encounter: Payer: Self-pay | Admitting: Family Medicine

## 2020-06-11 VITALS — BP 125/82 | Wt 197.8 lb

## 2020-06-11 DIAGNOSIS — J4530 Mild persistent asthma, uncomplicated: Secondary | ICD-10-CM

## 2020-06-11 DIAGNOSIS — E7800 Pure hypercholesterolemia, unspecified: Secondary | ICD-10-CM

## 2020-06-11 DIAGNOSIS — I10 Essential (primary) hypertension: Secondary | ICD-10-CM | POA: Diagnosis not present

## 2020-06-11 DIAGNOSIS — I700 Atherosclerosis of aorta: Secondary | ICD-10-CM | POA: Diagnosis not present

## 2020-06-11 DIAGNOSIS — R7303 Prediabetes: Secondary | ICD-10-CM

## 2020-06-11 DIAGNOSIS — Z789 Other specified health status: Secondary | ICD-10-CM | POA: Insufficient documentation

## 2020-06-11 DIAGNOSIS — M8580 Other specified disorders of bone density and structure, unspecified site: Secondary | ICD-10-CM

## 2020-06-11 DIAGNOSIS — Z5320 Procedure and treatment not carried out because of patient's decision for unspecified reasons: Secondary | ICD-10-CM

## 2020-06-11 DIAGNOSIS — Z2821 Immunization not carried out because of patient refusal: Secondary | ICD-10-CM

## 2020-06-11 NOTE — Progress Notes (Signed)
Subjective:  Documentation for virtual audio and video telecommunications through [place] encounter:  The patient was located at home. 2 patient identifiers used.  The provider was located in the office. The patient did consent to this visit and is aware of possible charges through their insurance for this visit.  The other persons participating in this telemedicine service were none. Time spent on call was 15 minutes and in review of previous records 20 minutes total.  This virtual service is not related to other E/M service within previous 7 days.   Patient ID: Kelly Mccann, female    DOB: [birth year], 74 y.o.   MRN: [id number]  HPI Chief Complaint  Patient presents with   med check    med check - no concerns, declines flu shot   This is a medication management visit.  Since our last visit she has reportedly stopped lovastatin, Edarbyclor and Symbicort and is feeling well.  She did not let me know as she stopped the medications.  Other doctors caring for patient include: Dr. [name]- eyes Dr. Melvyn Novas- pulmonologist  Dr. Carlean Purl- GI   She was seen in office for a Medicare Wellness and CPE in May 2021. She declined all preventive health care at that time.   Asthma - states she stopped the Symbicort because she did not need it. Breo worked better for her but it was not affordable.  Albuterol use is 1-2 times per week. Denies chest pain, shortness of breath, wheezing, coughing.   HTN- states she stopped Edarbyclor for no particular reason. States she is doing fine. Her BP is in the 120s/70s-80s.   Prediabetes- Hgb A1c 5.8% on 12/06/2019  HL- LDL 124 on 12/06/2019 States lovastatin was making her fingers numb and tingling and when she stopped  She is   Osteopenia- last DEXA 2018  She is losing weight. Walking more and she is eating healthier. States she does not eat meat any longer.   Denies fever, chills, dizziness, chest pain, palpitations, abdominal pain, N/V/D,  urinary symptoms, LE edema.   Reviewed allergies, medications, past medical, surgical, family, and social history.   Review of Systems Pertinent positives and negatives in the history of present illness.     Objective:   Physical Exam BP 125/82    Wt 197 lb 12.8 oz (89.7 kg)    BMI 32.66 kg/m   Alert and oriented and in no acute distress.  Speaking in complete sentences without difficulty.  Normal speech, mood, memory and thought process.      Assessment & Plan:  Mild persistent asthma without complication  Prediabetes - Plan: Comprehensive metabolic panel, TSH, Hemoglobin A1c  Primary hypertension - Plan: CBC with Differential/Platelet, Comprehensive metabolic panel  Hypercholesterolemia - Plan: Lipid panel  Atherosclerosis of aorta (HCC) - Plan: Lipid panel  Osteopenia determined by x-ray  Mammogram declined  Immunization refused  Statin not tolerated  This is a virtual medication management visit.  She has reportedly stopped her medications and is feeling well.  She stopped Symbicort.  Is not currently needing albuterol very often.  No recent asthma flares.   She stopped Edarbyclor on her own and is encouraged to keep an eye on her blood pressure at home and let me know if it is not in goal range.    She also stopped her statin due to side effects which resolved shortly after stopping the medication.  She is aware that she has atherosclerosis and elevated LDL increasing her risk of heart disease and other  chronic health conditions.   She declines any preventive health care at this time including mammogram, DEXA, and immunizations.  Encouraged healthy diet and exercise and following up if her blood pressure is not in goal range. She will call and schedule a lab visit and come in fasting in the next week.  The orders are in the computer.  I will follow-up pending results

## 2020-07-09 ENCOUNTER — Other Ambulatory Visit: Payer: Medicare Other

## 2020-07-09 ENCOUNTER — Other Ambulatory Visit: Payer: Self-pay

## 2020-07-09 DIAGNOSIS — R7303 Prediabetes: Secondary | ICD-10-CM | POA: Diagnosis not present

## 2020-07-09 DIAGNOSIS — I700 Atherosclerosis of aorta: Secondary | ICD-10-CM | POA: Diagnosis not present

## 2020-07-09 DIAGNOSIS — I10 Essential (primary) hypertension: Secondary | ICD-10-CM

## 2020-07-09 DIAGNOSIS — E7800 Pure hypercholesterolemia, unspecified: Secondary | ICD-10-CM | POA: Diagnosis not present

## 2020-07-10 LAB — COMPREHENSIVE METABOLIC PANEL
ALT: 15 IU/L (ref 0–32)
AST: 14 IU/L (ref 0–40)
Albumin/Globulin Ratio: 1.8 (ref 1.2–2.2)
Albumin: 4.4 g/dL (ref 3.7–4.7)
Alkaline Phosphatase: 87 IU/L (ref 44–121)
BUN/Creatinine Ratio: 12 (ref 12–28)
BUN: 13 mg/dL (ref 8–27)
Bilirubin Total: 0.5 mg/dL (ref 0.0–1.2)
CO2: 27 mmol/L (ref 20–29)
Calcium: 9.6 mg/dL (ref 8.7–10.3)
Chloride: 98 mmol/L (ref 96–106)
Creatinine, Ser: 1.07 mg/dL — ABNORMAL HIGH (ref 0.57–1.00)
GFR calc Af Amer: 60 mL/min/{1.73_m2} (ref >59)
GFR calc non Af Amer: 52 mL/min/{1.73_m2} — ABNORMAL LOW (ref >59)
Globulin, Total: 2.5 g/dL (ref 1.5–4.5)
Glucose: 108 mg/dL — ABNORMAL HIGH (ref 65–99)
Potassium: 3.7 mmol/L (ref 3.5–5.2)
Sodium: 140 mmol/L (ref 134–144)
Total Protein: 6.9 g/dL (ref 6.0–8.5)

## 2020-07-10 LAB — CBC WITH DIFFERENTIAL/PLATELET
Basophils Absolute: 0 10*3/uL (ref 0.0–0.2)
Basos: 1 %
EOS (ABSOLUTE): 0.3 10*3/uL (ref 0.0–0.4)
Eos: 5 %
Hematocrit: 41.3 % (ref 34.0–46.6)
Hemoglobin: 14.2 g/dL (ref 11.1–15.9)
Immature Grans (Abs): 0 10*3/uL (ref 0.0–0.1)
Immature Granulocytes: 0 %
Lymphocytes Absolute: 2.3 10*3/uL (ref 0.7–3.1)
Lymphs: 46 %
MCH: 31.1 pg (ref 26.6–33.0)
MCHC: 34.4 g/dL (ref 31.5–35.7)
MCV: 91 fL (ref 79–97)
Monocytes Absolute: 0.4 10*3/uL (ref 0.1–0.9)
Monocytes: 9 %
Neutrophils Absolute: 1.9 10*3/uL (ref 1.4–7.0)
Neutrophils: 39 %
Platelets: 304 10*3/uL (ref 150–450)
RBC: 4.56 x10E6/uL (ref 3.77–5.28)
RDW: 13.6 % (ref 11.7–15.4)
WBC: 4.9 10*3/uL (ref 3.4–10.8)

## 2020-07-10 LAB — LIPID PANEL
Chol/HDL Ratio: 3.5 ratio (ref 0.0–4.4)
Cholesterol, Total: 261 mg/dL — ABNORMAL HIGH (ref 100–199)
HDL: 75 mg/dL (ref >39)
LDL Chol Calc (NIH): 154 mg/dL — ABNORMAL HIGH (ref 0–99)
Triglycerides: 182 mg/dL — ABNORMAL HIGH (ref 0–149)
VLDL Cholesterol Cal: 32 mg/dL (ref 5–40)

## 2020-07-10 LAB — HEMOGLOBIN A1C
Est. average glucose Bld gHb Est-mCnc: 126 mg/dL
Hgb A1c MFr Bld: 6 % — ABNORMAL HIGH (ref 4.8–5.6)

## 2020-07-10 LAB — TSH: TSH: 3.39 u[IU]/mL (ref 0.450–4.500)

## 2020-07-10 NOTE — Progress Notes (Signed)
Her blood sugars are stable with Hgb A1c 6.0% and still in prediabetes range. Kidney function is slightly better. Her cholesterol is worse. Is she taking any of her medications? Lovastatin?

## 2020-12-06 ENCOUNTER — Other Ambulatory Visit: Payer: Self-pay

## 2020-12-06 ENCOUNTER — Telehealth: Payer: Self-pay

## 2020-12-06 ENCOUNTER — Ambulatory Visit (INDEPENDENT_AMBULATORY_CARE_PROVIDER_SITE_OTHER): Payer: Medicare Other | Admitting: Family Medicine

## 2020-12-06 VITALS — BP 140/90 | HR 94 | Ht 66.5 in | Wt 200.0 lb

## 2020-12-06 DIAGNOSIS — M25561 Pain in right knee: Secondary | ICD-10-CM | POA: Diagnosis not present

## 2020-12-06 DIAGNOSIS — Z5320 Procedure and treatment not carried out because of patient's decision for unspecified reasons: Secondary | ICD-10-CM | POA: Diagnosis not present

## 2020-12-06 DIAGNOSIS — M8580 Other specified disorders of bone density and structure, unspecified site: Secondary | ICD-10-CM

## 2020-12-06 DIAGNOSIS — I700 Atherosclerosis of aorta: Secondary | ICD-10-CM | POA: Diagnosis not present

## 2020-12-06 DIAGNOSIS — J4530 Mild persistent asthma, uncomplicated: Secondary | ICD-10-CM | POA: Diagnosis not present

## 2020-12-06 DIAGNOSIS — E7800 Pure hypercholesterolemia, unspecified: Secondary | ICD-10-CM

## 2020-12-06 DIAGNOSIS — G8929 Other chronic pain: Secondary | ICD-10-CM | POA: Diagnosis not present

## 2020-12-06 DIAGNOSIS — R7303 Prediabetes: Secondary | ICD-10-CM

## 2020-12-06 DIAGNOSIS — Z789 Other specified health status: Secondary | ICD-10-CM | POA: Diagnosis not present

## 2020-12-06 DIAGNOSIS — Z0000 Encounter for general adult medical examination without abnormal findings: Secondary | ICD-10-CM

## 2020-12-06 DIAGNOSIS — I10 Essential (primary) hypertension: Secondary | ICD-10-CM

## 2020-12-06 MED ORDER — EDARBYCLOR 40-25 MG PO TABS: 1.0000 | ORAL_TABLET | Freq: Every day | ORAL | 1 refills | Status: DC

## 2020-12-06 MED ORDER — LOVASTATIN 20 MG PO TABS
20.0000 mg | ORAL_TABLET | Freq: Every day | ORAL | 1 refills | Status: DC
Start: 2020-12-06 — End: 2022-04-09

## 2020-12-06 NOTE — Telephone Encounter (Signed)
Called Edarbyclor into [facility] for mail order 30 days is $45 and 90 days is $120.  Advised pt they would be calling her.  Samples given to patient to hold.

## 2020-12-06 NOTE — Patient Instructions (Signed)
  Kelly Mccann , Thank you for taking time to come for your Medicare Wellness Visit. I appreciate your ongoing commitment to your health goals. Please review the following plan we discussed and let me know if I can assist you in the future.   These are the goals we discussed:     This is a list of the screening recommended for you and due dates:  Health Maintenance  Topic Date Due  . Tetanus Vaccine  Never done  . Hepatitis C Screening: USPSTF Recommendation to screen - Ages 60-79 yo.  Completed  . HPV Vaccine  Aged Out  . Flu Shot  Discontinued  . COVID-19 Vaccine  Discontinued

## 2020-12-06 NOTE — Progress Notes (Signed)
Kelly Mccann is a 75 y.o. female who presents for annual wellness visit and follow-up on chronic medical conditions.  She has the following concerns:  When she first arrived she was unwilling to wear a mask which is [facility] policy in the setting of COVID pandemic.  When I went to the room and told her this was required in order to be seen in our facility, for she did not want to be seen and stated she leaving but she needed to get a handicap placard to go due to chronic knee pain related to arthritis.  States her previous placard expired.  She then decided she would wear the mask and be seen for hypertension.  States her medication, Carole Binning, is no longer affordable and she would like to try something else.  In her past she did not tolerate verapamil or amlodipine and does not want to be put back on those medications.   HL, atherosclerosis and hypertriglyceridemia -states she is taking lovastatin every other day  Asthma-denies having any issues  CKD- improved as of 07/2020  Osteopenia-declines DEXA  States she lost her grandson which has made her sad.  States she is not interested in counseling.     Immunization History  Administered Date(s) Administered  . Pneumococcal Conjugate-13 12/03/2016   Last Pap smear: hysterectomy Last mammogram: declines Last colonoscopy: declines Last DEXA: declines Dentist: dentures Ophtho: [place] Exercise: cleaning  Declines any shots as she does not do shots she says   Other doctors caring for patient include: Dr. [name]- eyes Dr. Melvyn Novas- pulmonologist  Dr. Carlean Purl- GI    Depression screen:  See questionnaire below.  Depression screen [facility] 2/9 12/06/2020 06/11/2020 12/06/2019 08/08/2019 10/06/2018  Decreased Interest 3 0 0 0 1  Down, Depressed, Hopeless 0 0 0 0 0  PHQ - 2 Score 3 0 0 0 1  Altered sleeping 0 - - - -  Tired, decreased energy 0 - - - -  Change in appetite 0 - - - -  Feeling bad or failure about yourself  0 - - - -   Trouble concentrating 0 - - - -  Moving slowly or fidgety/restless 1 - - - -  Suicidal thoughts 0 - - - -  PHQ-9 Score 4 - - - -  Difficult doing work/chores Not difficult at all - - - -    Fall Risk Screen: see questionnaire below. Fall Risk  12/06/2020 06/11/2020 12/06/2019 08/08/2019 10/06/2018  Falls in the past year? 0 0 0 0 0  Number falls in past yr: 0 0 0 0 0  Injury with Fall? - 0 0 0 0  Risk for fall due to : No Fall Risks - - - -  Follow up Falls evaluation completed - - - -    ADL screen:  See questionnaire below Functional Status Survey: Is the patient deaf or have difficulty hearing?: No Does the patient have difficulty seeing, even when wearing glasses/contacts?: No Does the patient have difficulty concentrating, remembering, or making decisions?: No Does the patient have difficulty walking or climbing stairs?: Yes Does the patient have difficulty dressing or bathing?: No Does the patient have difficulty doing errands alone such as visiting a doctor's office or shopping?: No   End of Life Discussion:  Patient has a living will and medical power of attorney  Review of Systems Constitutional: -fever, -chills, -sweats, -unexpected weight change, -anorexia, -fatigue Allergy: -sneezing, -itching, -congestion Dermatology: denies changing moles, rash, lumps, new worrisome lesions ENT: -runny  nose, -ear pain, -sore throat, -hoarseness, -sinus pain, -teeth pain, -tinnitus, -hearing loss, -epistaxis Cardiology:  -chest pain, -palpitations, -edema, -orthopnea, -paroxysmal nocturnal dyspnea Respiratory: -cough, -shortness of breath, -dyspnea on exertion, -wheezing, -hemoptysis Gastroenterology: -abdominal pain, -nausea, -vomiting, -diarrhea, -constipation, -blood in stool, -changes in bowel movement, -dysphagia Hematology: -bleeding or bruising problems Musculoskeletal: -arthralgias, -myalgias, -joint swelling, -back pain, -neck pain, -cramping, -gait changes Ophthalmology: -vision  changes, -eye redness, -itching, -discharge Urology: -dysuria, -difficulty urinating, -hematuria, -urinary frequency, -urgency, incontinence Neurology: -headache, -weakness, -tingling, -numbness, -speech abnormality, -memory loss, -falls, -dizziness Psychology:  -depressed mood, -agitation, -sleep problems    PHYSICAL EXAM:  BP 140/90   Pulse 94   Ht 5' 6.5" (1.689 m)   Wt 200 lb (90.7 kg)   BMI 31.80 kg/m   General Appearance: Alert, cooperative, no distress, appears stated age Declines physical exam   ASSESSMENT/PLAN: Medicare annual wellness visit, subsequent -denies issues with depression, ADLs, memory and no falls.    Primary hypertension - Plan: CBC with Differential/Platelet, Comprehensive metabolic panel Samples of Edarbyclor provided and we will send her prescription to mail order which will be more affordable.  She is pleased with this plan.  Prediabetes - Plan: Comprehensive metabolic panel -follow up pending results   Mild persistent asthma without complication -controlled   Hypercholesterolemia - Plan: Lipid panel Continue statin   Atherosclerosis of aorta (HCC) - Plan: Lipid panel Continue statin   Osteopenia determined by x-ray -declines DEXA  Colonoscopy refused  Mammogram declined  Advance directive in chart  Chronic pain of right knee -handicap placard filled out     Discussed monthly self breast exams and yearly mammograms; at least 30 minutes of aerobic activity at least 5 days/week and weight-bearing exercise 2x/week; proper sunscreen use reviewed; healthy diet, including goals of calcium and vitamin D intake and alcohol recommendations (less than or equal to 1 drink/day) reviewed; regular seatbelt use; changing batteries in smoke detectors.  Immunization recommendations discussed.  Colonoscopy recommendations reviewed   Medicare Attestation I have personally reviewed: The patient's medical and social history Their use of alcohol,  tobacco or illicit drugs Their current medications and supplements The patient's functional ability including ADLs,fall risks, home safety risks, cognitive, and hearing and visual impairment Diet and physical activities Evidence for depression or mood disorders  The patient's weight, height, and BMI have been recorded in the chart.  I have made referrals, counseling, and provided education to the patient based on review of the above and I have provided the patient with a written personalized care plan for preventive services.     Harland Dingwall, NP-C   12/06/2020

## 2020-12-06 NOTE — Telephone Encounter (Signed)
Correcting pharmacy

## 2020-12-07 LAB — CBC WITH DIFFERENTIAL/PLATELET
Basophils Absolute: 0 10*3/uL (ref 0.0–0.2)
Basos: 1 %
EOS (ABSOLUTE): 0.2 10*3/uL (ref 0.0–0.4)
Eos: 5 %
Hematocrit: 42.6 % (ref 34.0–46.6)
Hemoglobin: 14.4 g/dL (ref 11.1–15.9)
Immature Grans (Abs): 0 10*3/uL (ref 0.0–0.1)
Immature Granulocytes: 0 %
Lymphocytes Absolute: 1.9 10*3/uL (ref 0.7–3.1)
Lymphs: 42 %
MCH: 31 pg (ref 26.6–33.0)
MCHC: 33.8 g/dL (ref 31.5–35.7)
MCV: 92 fL (ref 79–97)
Monocytes Absolute: 0.4 10*3/uL (ref 0.1–0.9)
Monocytes: 10 %
Neutrophils Absolute: 1.9 10*3/uL (ref 1.4–7.0)
Neutrophils: 42 %
Platelets: 288 10*3/uL (ref 150–450)
RBC: 4.64 x10E6/uL (ref 3.77–5.28)
RDW: 13.2 % (ref 11.7–15.4)
WBC: 4.4 10*3/uL (ref 3.4–10.8)

## 2020-12-07 LAB — COMPREHENSIVE METABOLIC PANEL
ALT: 15 IU/L (ref 0–32)
AST: 24 IU/L (ref 0–40)
Albumin/Globulin Ratio: 1.8 (ref 1.2–2.2)
Albumin: 4.4 g/dL (ref 3.7–4.7)
Alkaline Phosphatase: 82 IU/L (ref 44–121)
BUN/Creatinine Ratio: 15 (ref 12–28)
BUN: 14 mg/dL (ref 8–27)
Bilirubin Total: 0.6 mg/dL (ref 0.0–1.2)
CO2: 27 mmol/L (ref 20–29)
Calcium: 9.8 mg/dL (ref 8.7–10.3)
Chloride: 103 mmol/L (ref 96–106)
Creatinine, Ser: 0.92 mg/dL (ref 0.57–1.00)
Globulin, Total: 2.4 g/dL (ref 1.5–4.5)
Glucose: 101 mg/dL — ABNORMAL HIGH (ref 65–99)
Potassium: 4.6 mmol/L (ref 3.5–5.2)
Sodium: 144 mmol/L (ref 134–144)
Total Protein: 6.8 g/dL (ref 6.0–8.5)
eGFR: 66 mL/min/{1.73_m2} (ref >59)

## 2020-12-07 LAB — LIPID PANEL
Chol/HDL Ratio: 2.6 ratio (ref 0.0–4.4)
Cholesterol, Total: 211 mg/dL — ABNORMAL HIGH (ref 100–199)
HDL: 81 mg/dL (ref >39)
LDL Chol Calc (NIH): 108 mg/dL — ABNORMAL HIGH (ref 0–99)
Triglycerides: 128 mg/dL (ref 0–149)
VLDL Cholesterol Cal: 22 mg/dL (ref 5–40)

## 2021-09-12 ENCOUNTER — Ambulatory Visit (INDEPENDENT_AMBULATORY_CARE_PROVIDER_SITE_OTHER): Payer: Medicare Other

## 2021-09-12 VITALS — Ht 66.5 in | Wt 204.4 lb

## 2021-09-12 DIAGNOSIS — Z0000 Encounter for general adult medical examination without abnormal findings: Secondary | ICD-10-CM | POA: Diagnosis not present

## 2021-09-12 NOTE — Progress Notes (Signed)
I connected with Kelly Mccann today by telephone and verified that I am speaking with the correct person using two identifiers. Location patient: home Location provider: work Persons participating in the virtual visit: Rikia, Sukhu LPN.   I discussed the limitations, risks, security and privacy concerns of performing an evaluation and management service by telephone and the availability of in person appointments. I also discussed with the patient that there may be a patient responsible charge related to this service. The patient expressed understanding and verbally consented to this telephonic visit.    Interactive audio and video telecommunications were attempted between this provider and patient, however failed, due to patient having technical difficulties OR patient did not have access to video capability.  We continued and completed visit with audio only.     Vital signs may be patient reported or missing.  Subjective:   Kelly Mccann is a 76 y.o. female who presents for Medicare Annual (Subsequent) preventive examination.  Review of Systems     Cardiac Risk Factors include: advanced age (>57men, >75 women);obesity (BMI >30kg/m2)     Objective:    Today's Vitals   09/12/21 1340  Weight: 204 lb 6.4 oz (92.7 kg)  Height: 5' 6.5" (1.689 m)   Body mass index is 32.5 kg/m.  Advanced Directives 09/12/2021 09/12/2021 09/15/2016 03/14/2016 08/18/2015  Does Patient Have a Medical Advance Directive? Yes Yes No No No  Type of Advance Directive Out of facility DNR (pink MOST or yellow form) [place];Living will - - -  Copy of [place] in Chart? - Yes - validated most recent copy scanned in chart (See row information) - - -  Would patient like information on creating a medical advance directive? - - Yes (MAU/Ambulatory/Procedural Areas - Information given) Yes - Educational materials given -    Current Medications  (verified) Outpatient Encounter Medications as of 09/12/2021  Medication Sig   Azilsartan-Chlorthalidone (EDARBYCLOR) 40-25 MG TABS Take 1 tablet by mouth daily. (Patient not taking: Reported on 09/12/2021)   loratadine (CLARITIN) 10 MG tablet Take 10 mg by mouth daily. (Patient not taking: Reported on 09/12/2021)   lovastatin (MEVACOR) 20 MG tablet Take 1 tablet (20 mg total) by mouth at bedtime. (Patient not taking: Reported on 09/12/2021)   No facility-administered encounter medications on file as of 09/12/2021.    Allergies (verified) Patient has no known allergies.   History: Past Medical History:  Diagnosis Date   Arthritis    Asthma    Atherosclerosis    Hx of adenomatous colonic polyps 04/09/2016   Hypercholesterolemia    Hypertension    Osteopenia determined by x-ray    Prediabetes    Past Surgical History:  Procedure Laterality Date   BREAST EXCISIONAL BIOPSY  2004   PARTIAL HYSTERECTOMY  1990   Family History  Problem Relation Age of Onset   Alzheimer's disease Mother    Heart disease Father 22   Leukemia Maternal Aunt    Asthma Maternal Grandmother    Stroke Brother 17       half brother   Cancer Brother    Social History   Socioeconomic History   Marital status: Widowed    Spouse name: Not on file   Number of children: 3   Years of education: Not on file   Highest education level: Not on file  Occupational History   Occupation: retired  Tobacco Use   Smoking status: Former    Packs/day: 0.25  Years: 32.00    Pack years: 8.00    Types: Cigarettes    Quit date: 08/18/1999    Years since quitting: 22.0    Passive exposure: Never   Smokeless tobacco: Never  Vaping Use   Vaping Use: Never used  Substance and Sexual Activity   Alcohol use: Not Currently    Comment: occasional red wine   Drug use: No   Sexual activity: Never  Other Topics Concern   Not on file  Social History Narrative   Not on file   Social Determinants of Health   Financial  Resource Strain: Low Risk    Difficulty of Paying Living Expenses: Not hard at all  Food Insecurity: No Food Insecurity   Worried About Charity fundraiser in the Last Year: Never true   Ran Out of Food in the Last Year: Never true  Transportation Needs: No Transportation Needs   Lack of Transportation (Medical): No   Lack of Transportation (Non-Medical): No  Physical Activity: Inactive   Days of Exercise per Week: 0 days   Minutes of Exercise per Session: 0 min  Stress: No Stress Concern Present   Feeling of Stress : Not at all  Social Connections: Not on file    Tobacco Counseling Counseling given: Not Answered   Clinical Intake:  Pre-visit preparation completed: Yes  Pain : No/denies pain     Nutritional Status: BMI > 30  Obese Nutritional Risks: None Diabetes: No  How often do you need to have someone help you when you read instructions, pamphlets, or other written materials from your doctor or pharmacy?: 1 - Never What is the last grade level you completed in school?: 51yr college  Diabetic? no  Interpreter Needed?: No  Information entered by :: NAllen LPN   Activities of Daily Living In your present state of health, do you have any difficulty performing the following activities: 09/12/2021 12/06/2020  Hearing? N N  Vision? N N  Difficulty concentrating or making decisions? N N  Walking or climbing stairs? N Y  Dressing or bathing? N N  Doing errands, shopping? N N  Preparing Food and eating ? N N  Using the Toilet? N N  In the past six months, have you accidently leaked urine? N N  Do you have problems with loss of bowel control? N N  Managing your Medications? N N  Managing your Finances? N N  Housekeeping or managing your Housekeeping? N N  Some recent data might be hidden    Patient Care Team: Davy Pique as PCP - General (Family Medicine)  Indicate any recent Medical Services you may have received from other than [facility] providers in the  past year (date may be approximate).     Assessment:   This is a routine wellness examination for Kelly Mccann.  Hearing/Vision screen Vision Screening - Comments:: No regular eye exams,  Dietary issues and exercise activities discussed: Current Exercise Habits: The patient does not participate in regular exercise at present   Goals Addressed             This Visit's Progress    Patient Stated       09/12/2021, wants to lose weight       Depression Screen PHQ 2/9 Scores 09/12/2021 12/06/2020 06/11/2020 12/06/2019 08/08/2019 10/06/2018 12/24/2016  PHQ - 2 Score 0 3 0 0 0 1 0  PHQ- 9 Score - 4 - - - - -    Fall Risk Fall Risk  09/12/2021 12/06/2020 06/11/2020 12/06/2019 08/08/2019  Falls in the past year? 0 0 0 0 0  Number falls in past yr: - 0 0 0 0  Injury with Fall? - - 0 0 0  Risk for fall due to : No Fall Risks No Fall Risks - - -  Follow up Falls evaluation completed;Education provided;Falls prevention discussed Falls evaluation completed - - -    FALL RISK PREVENTION PERTAINING TO THE HOME:  Any stairs in or around the home? Yes  If so, are there any without handrails? No  Home free of loose throw rugs in walkways, pet beds, electrical cords, etc? Yes  Adequate lighting in your home to reduce risk of falls? Yes   ASSISTIVE DEVICES UTILIZED TO PREVENT FALLS:  Life alert? No  Use of a cane, walker or w/c? No  Grab bars in the bathroom? Yes  Shower chair or bench in shower? No  Elevated toilet seat or a handicapped toilet? Yes   TIMED UP AND GO:  Was the test performed? No .       Cognitive Function:        Immunizations Immunization History  Administered Date(s) Administered   Pneumococcal Conjugate-13 12/03/2016    TDAP status: Due, Education has been provided regarding the importance of this vaccine. Advised may receive this vaccine at local pharmacy or Health Dept. Aware to provide a copy of the vaccination record if obtained from local pharmacy or Health Dept.  Verbalized acceptance and understanding.  Flu Vaccine status: Declined, Education has been provided regarding the importance of this vaccine but patient still declined. Advised may receive this vaccine at local pharmacy or Health Dept. Aware to provide a copy of the vaccination record if obtained from local pharmacy or Health Dept. Verbalized acceptance and understanding.  Pneumococcal vaccine status: Declined,  Education has been provided regarding the importance of this vaccine but patient still declined. Advised may receive this vaccine at local pharmacy or Health Dept. Aware to provide a copy of the vaccination record if obtained from local pharmacy or Health Dept. Verbalized acceptance and understanding.   Covid-19 vaccine status: Completed vaccines  Qualifies for Shingles Vaccine? Yes   Zostavax completed Yes   Shingrix Completed?: No.    Education has been provided regarding the importance of this vaccine. Patient has been advised to call insurance company to determine out of pocket expense if they have not yet received this vaccine. Advised may also receive vaccine at local pharmacy or Health Dept. Verbalized acceptance and understanding.  Screening Tests Health Maintenance  Topic Date Due   Zoster Vaccines- Shingrix (1 of 2) 12/10/2021 (Originally 08/09/1997)   Pneumonia Vaccine 32+ Years old (2 - PPSV23 if available, else PCV20) 09/12/2022 (Originally 12/03/2017)   TETANUS/TDAP  09/12/2022 (Originally [birth year]1966)   Hepatitis C Screening  Completed   HPV VACCINES  Aged Out   INFLUENZA VACCINE  Discontinued   COLONOSCOPY (Pts 45-46yrs Insurance coverage will need to be confirmed)  Discontinued   COVID-19 Vaccine  Discontinued    Health Maintenance  There are no preventive care reminders to display for this patient.   Colorectal cancer screening: No longer required.   Mammogram status: decline  Bone Density status: Completed 12/31/2016.   Lung Cancer Screening: (Low Dose CT Chest  recommended if Age 50-80 years, 30 pack-year currently smoking OR have quit w/in 15years.) does not qualify.   Lung Cancer Screening Referral: no  Additional Screening:  Hepatitis C Screening: does qualify; Completed 12/24/2016  Vision Screening:  Recommended annual ophthalmology exams for early detection of glaucoma and other disorders of the eye. Is the patient up to date with their annual eye exam?  No  Who is the provider or what is the name of the office in which the patient attends annual eye exams? none If pt is not established with a provider, would they like to be referred to a provider to establish care? No .   Dental Screening: Recommended annual dental exams for proper oral hygiene  Community Resource Referral / Chronic Care Management: CRR required this visit?  No   CCM required this visit?  No      Plan:     I have personally reviewed and noted the following in the patients chart:   Medical and social history Use of alcohol, tobacco or illicit drugs  Current medications and supplements including opioid prescriptions.  Functional ability and status Nutritional status Physical activity Advanced directives List of other physicians Hospitalizations, surgeries, and ER visits in previous 12 months Vitals Screenings to include cognitive, depression, and falls Referrals and appointments  In addition, I have reviewed and discussed with patient certain preventive protocols, quality metrics, and best practice recommendations. A written personalized care plan for preventive services as well as general preventive health recommendations were provided to patient.     Kellie Simmering, LPN   0/10/4915   Nurse Notes: 6 CIT not administered. Patient had normal cognition with conversation.  Due to this being a virtual visit, the after visit summary with patients personalized plan was offered to patient via mail or my-chart. Patient declined at this time.

## 2021-09-12 NOTE — Patient Instructions (Signed)
Ms. Elsbernd , Thank you for taking time to come for your Medicare Wellness Visit. I appreciate your ongoing commitment to your health goals. Please review the following plan we discussed and let me know if I can assist you in the future.   Screening recommendations/referrals: Colonoscopy: decline Mammogram: decline Bone Density: completed 12/31/2016 Recommended yearly ophthalmology/optometry visit for glaucoma screening and checkup Recommended yearly dental visit for hygiene and checkup  Vaccinations: Influenza vaccine: decline Pneumococcal vaccine: decline Tdap vaccine: decline Shingles vaccine: decline   Covid-19:decline  Advanced directives: copy in chart  Conditions/risks identified: none  Next appointment: Follow up in one year for your annual wellness visit    Preventive Care 65 Years and Older, Female Preventive care refers to lifestyle choices and visits with your health care provider that can promote health and wellness. What does preventive care include? A yearly physical exam. This is also called an annual well check. Dental exams once or twice a year. Routine eye exams. Ask your health care provider how often you should have your eyes checked. Personal lifestyle choices, including: Daily care of your teeth and gums. Regular physical activity. Eating a healthy diet. Avoiding tobacco and drug use. Limiting alcohol use. Practicing safe sex. Taking low-dose aspirin every day. Taking vitamin and mineral supplements as recommended by your health care provider. What happens during an annual well check? The services and screenings done by your health care provider during your annual well check will depend on your age, overall health, lifestyle risk factors, and family history of disease. Counseling  Your health care provider may ask you questions about your: Alcohol use. Tobacco use. Drug use. Emotional well-being. Home and relationship well-being. Sexual  activity. Eating habits. History of falls. Memory and ability to understand (cognition). Work and work Statistician. Reproductive health. Screening  You may have the following tests or measurements: Height, weight, and BMI. Blood pressure. Lipid and cholesterol levels. These may be checked every 5 years, or more frequently if you are over 52 years old. Skin check. Lung cancer screening. You may have this screening every year starting at age 48 if you have a 30-pack-year history of smoking and currently smoke or have quit within the past 15 years. Fecal occult blood test (FOBT) of the stool. You may have this test every year starting at age 36. Flexible sigmoidoscopy or colonoscopy. You may have a sigmoidoscopy every 5 years or a colonoscopy every 10 years starting at age 3. Hepatitis C blood test. Hepatitis B blood test. Sexually transmitted disease (STD) testing. Diabetes screening. This is done by checking your blood sugar (glucose) after you have not eaten for a while (fasting). You may have this done every 1-3 years. Bone density scan. This is done to screen for osteoporosis. You may have this done starting at age 93. Mammogram. This may be done every 1-2 years. Talk to your health care provider about how often you should have regular mammograms. Talk with your health care provider about your test results, treatment options, and if necessary, the need for more tests. Vaccines  Your health care provider may recommend certain vaccines, such as: Influenza vaccine. This is recommended every year. Tetanus, diphtheria, and acellular pertussis (Tdap, Td) vaccine. You may need a Td booster every 10 years. Zoster vaccine. You may need this after age 51. Pneumococcal 13-valent conjugate (PCV13) vaccine. One dose is recommended after age 33. Pneumococcal polysaccharide (PPSV23) vaccine. One dose is recommended after age 27. Talk to your health care provider about which screenings  and vaccines  you need and how often you need them. This information is not intended to replace advice given to you by your health care provider. Make sure you discuss any questions you have with your health care provider. Document Released: 08/16/2015 Document Revised: 04/08/2016 Document Reviewed: 05/21/2015 Elsevier Interactive Patient Education  2017 [employer] Prevention in the Home Falls can cause injuries. They can happen to people of all ages. There are many things you can do to make your home safe and to help prevent falls. What can I do on the outside of my home? Regularly fix the edges of walkways and driveways and fix any cracks. Remove anything that might make you trip as you walk through a door, such as a raised step or threshold. Trim any bushes or trees on the path to your home. Use bright outdoor lighting. Clear any walking paths of anything that might make someone trip, such as rocks or tools. Regularly check to see if handrails are loose or broken. Make sure that both sides of any steps have handrails. Any raised decks and porches should have guardrails on the edges. Have any leaves, snow, or ice cleared regularly. Use sand or salt on walking paths during winter. Clean up any spills in your garage right away. This includes oil or grease spills. What can I do in the bathroom? Use night lights. Install grab bars by the toilet and in the tub and shower. Do not use towel bars as grab bars. Use non-skid mats or decals in the tub or shower. If you need to sit down in the shower, use a plastic, non-slip stool. Keep the floor dry. Clean up any water that spills on the floor as soon as it happens. Remove soap buildup in the tub or shower regularly. Attach bath mats securely with double-sided non-slip rug tape. Do not have throw rugs and other things on the floor that can make you trip. What can I do in the bedroom? Use night lights. Make sure that you have a light by your bed that  is easy to reach. Do not use any sheets or blankets that are too big for your bed. They should not hang down onto the floor. Have a firm chair that has side arms. You can use this for support while you get dressed. Do not have throw rugs and other things on the floor that can make you trip. What can I do in the kitchen? Clean up any spills right away. Avoid walking on wet floors. Keep items that you use a lot in easy-to-reach places. If you need to reach something above you, use a strong step stool that has a grab bar. Keep electrical cords out of the way. Do not use floor polish or wax that makes floors slippery. If you must use wax, use non-skid floor wax. Do not have throw rugs and other things on the floor that can make you trip. What can I do with my stairs? Do not leave any items on the stairs. Make sure that there are handrails on both sides of the stairs and use them. Fix handrails that are broken or loose. Make sure that handrails are as long as the stairways. Check any carpeting to make sure that it is firmly attached to the stairs. Fix any carpet that is loose or worn. Avoid having throw rugs at the top or bottom of the stairs. If you do have throw rugs, attach them to the floor with carpet tape.  Make sure that you have a light switch at the top of the stairs and the bottom of the stairs. If you do not have them, ask someone to add them for you. What else can I do to help prevent falls? Wear shoes that: Do not have high heels. Have rubber bottoms. Are comfortable and fit you well. Are closed at the toe. Do not wear sandals. If you use a stepladder: Make sure that it is fully opened. Do not climb a closed stepladder. Make sure that both sides of the stepladder are locked into place. Ask someone to hold it for you, if possible. Clearly mark and make sure that you can see: Any grab bars or handrails. First and last steps. Where the edge of each step is. Use tools that help you  move around (mobility aids) if they are needed. These include: Canes. Walkers. Scooters. Crutches. Turn on the lights when you go into a dark area. Replace any light bulbs as soon as they burn out. Set up your furniture so you have a clear path. Avoid moving your furniture around. If any of your floors are uneven, fix them. If there are any pets around you, be aware of where they are. Review your medicines with your doctor. Some medicines can make you feel dizzy. This can increase your chance of falling. Ask your doctor what other things that you can do to help prevent falls. This information is not intended to replace advice given to you by your health care provider. Make sure you discuss any questions you have with your health care provider. Document Released: 05/16/2009 Document Revised: 12/26/2015 Document Reviewed: 08/24/2014 Elsevier Interactive Patient Education  2017 [employer].

## 2021-09-14 ENCOUNTER — Encounter: Payer: Self-pay | Admitting: Internal Medicine

## 2022-02-06 ENCOUNTER — Other Ambulatory Visit: Payer: Self-pay

## 2022-02-06 ENCOUNTER — Encounter (HOSPITAL_COMMUNITY): Payer: Self-pay

## 2022-02-06 ENCOUNTER — Emergency Department (HOSPITAL_COMMUNITY)
Admission: EM | Admit: 2022-02-06 | Discharge: 2022-02-07 | Disposition: A | Discharge status: Home / Self Care | Payer: Medicare Other | Attending: Emergency Medicine | Admitting: Emergency Medicine

## 2022-02-06 DIAGNOSIS — R1032 Left lower quadrant pain: Secondary | ICD-10-CM | POA: Diagnosis not present

## 2022-02-06 DIAGNOSIS — R110 Nausea: Secondary | ICD-10-CM | POA: Diagnosis not present

## 2022-02-06 LAB — URINALYSIS, ROUTINE W REFLEX MICROSCOPIC
Bacteria, UA: NONE SEEN
Bilirubin Urine: NEGATIVE
Glucose, UA: NEGATIVE mg/dL
Ketones, ur: NEGATIVE mg/dL
Leukocytes,Ua: NEGATIVE
Nitrite: NEGATIVE
Protein, ur: NEGATIVE mg/dL
Specific Gravity, Urine: 1.011 (ref 1.005–1.030)
pH: 5 (ref 5.0–8.0)

## 2022-02-06 LAB — CBC
HCT: 46.6 % — ABNORMAL HIGH (ref 36.0–46.0)
Hemoglobin: 15.7 g/dL — ABNORMAL HIGH (ref 12.0–15.0)
MCH: 31.7 pg (ref 26.0–34.0)
MCHC: 33.7 g/dL (ref 30.0–36.0)
MCV: 94.1 fL (ref 80.0–100.0)
Platelets: 286 10*3/uL (ref 150–400)
RBC: 4.95 MIL/uL (ref 3.87–5.11)
RDW: 13.3 % (ref 11.5–15.5)
WBC: 6.6 10*3/uL (ref 4.0–10.5)
nRBC: 0 % (ref 0.0–0.2)

## 2022-02-06 NOTE — ED Provider Triage Note (Signed)
Emergency Medicine Provider Triage Evaluation Note  Kelly Mccann , a 76 y.o. female  was evaluated in triage.  Pt complains of left groin pain radiating into left leg. Worse when laying flat. Though was due to constipation so took Miralax, had BM however without relief. States she looked it up on like and she might have a " bowel blockage or sciatica. " No hx of either. No recent falls or injuries. No LE swelling, redness, warmth, numbness. Does endorse some occasional left lower paraspinal region pain. No incontinence, saddle paresthesia.  Review of Systems  Positive: Left leg pain Negative:   Physical Exam  BP (!) 158/139   Pulse 98   Temp 98.2 F (36.8 C)   Resp 20   Ht 5' 6.5" (1.689 m)   Wt 87.1 kg   SpO2 97%   BMI 30.53 kg/m  Gen:   Awake, no distress   Resp:  Normal effort  MSK:   Moves extremities without difficulty  Other:    Medical Decision Making  Medically screening exam initiated at 11:02 PM.  Appropriate orders placed.  Kelly Mccann was informed that the remainder of the evaluation will be completed by another provider, this initial triage assessment does not replace that evaluation, and the importance of remaining in the ED until their evaluation is complete.  Left groin, lower back pain   Inez Rosato A, PA-C 02/06/22 [id number]

## 2022-02-06 NOTE — ED Triage Notes (Signed)
Ambulatory to ED with c/o L sided abd pain x 2-3 days. States it feels like "theres something blocking my bowels". Also states she's having trouble urinating. Denies N/V/D, fever, chills.

## 2022-02-07 ENCOUNTER — Emergency Department (HOSPITAL_COMMUNITY): Payer: Medicare Other

## 2022-02-07 LAB — COMPREHENSIVE METABOLIC PANEL
ALT: 17 U/L (ref 0–44)
AST: 19 U/L (ref 15–41)
Albumin: 4.3 g/dL (ref 3.5–5.0)
Alkaline Phosphatase: 77 U/L (ref 38–126)
Anion gap: 12 (ref 5–15)
BUN: 23 mg/dL (ref 8–23)
CO2: 27 mmol/L (ref 22–32)
Calcium: 9.2 mg/dL (ref 8.9–10.3)
Chloride: 101 mmol/L (ref 98–111)
Creatinine, Ser: 1.17 mg/dL — ABNORMAL HIGH (ref 0.44–1.00)
GFR, Estimated: 49 mL/min — ABNORMAL LOW (ref >60)
Glucose, Bld: 110 mg/dL — ABNORMAL HIGH (ref 70–99)
Potassium: 3.1 mmol/L — ABNORMAL LOW (ref 3.5–5.1)
Sodium: 140 mmol/L (ref 135–145)
Total Bilirubin: 0.7 mg/dL (ref 0.3–1.2)
Total Protein: 7.3 g/dL (ref 6.5–8.1)

## 2022-02-07 LAB — LIPASE, BLOOD: Lipase: 29 U/L (ref 11–51)

## 2022-02-07 MED ORDER — IOHEXOL 300 MG/ML  SOLN
100.0000 mL | Freq: Once | INTRAMUSCULAR | Status: AC | PRN
  Administered 2022-02-07: 100 mL via INTRAVENOUS

## 2022-02-07 NOTE — ED Provider Notes (Signed)
[facility]PT  Provider Note  CSN: [id number] Arrival date & time: 02/06/22 2220  History Chief Complaint  Patient presents with   Abdominal Pain    Kelly Mccann is a 76 y.o. female here for evaluation of 3 days of gradually worsening LLQ abdominal pain. Some nausea but no vomiting. She has had increased urine frequency and decreased stool output. She denies any fevers. No prior abdominal surgeries.    Home Medications Prior to Admission medications   Medication Sig Start Date End Date Taking? Authorizing Provider  Azilsartan-Chlorthalidone (EDARBYCLOR) 40-25 MG TABS Take 1 tablet by mouth daily. Patient not taking: Reported on 09/12/2021 12/06/20   Harland Dingwall L, NP-C  loratadine (CLARITIN) 10 MG tablet Take 10 mg by mouth daily. Patient not taking: Reported on 09/12/2021    [provider]  lovastatin (MEVACOR) 20 MG tablet Take 1 tablet (20 mg total) by mouth at bedtime. Patient not taking: Reported on 09/12/2021 12/06/20   Girtha Rm, NP-C     Allergies    Patient has no known allergies.   Review of Systems   Review of Systems Please see HPI for pertinent positives and negatives  Physical Exam BP (!) 176/115   Pulse 90   Temp 98.2 F (36.8 C)   Resp 20   Ht 5' 6.5" (1.689 m)   Wt 87.1 kg   SpO2 96%   BMI 30.53 kg/m   Physical Exam Vitals and nursing note reviewed.  Constitutional:      Appearance: Normal appearance.  HENT:     Head: Normocephalic and atraumatic.     Nose: Nose normal.     Mouth/Throat:     Mouth: Mucous membranes are moist.  Eyes:     Extraocular Movements: Extraocular movements intact.     Conjunctiva/sclera: Conjunctivae normal.  Cardiovascular:     Rate and Rhythm: Normal rate.  Pulmonary:     Effort: Pulmonary effort is normal.     Breath sounds: Normal breath sounds.  Abdominal:     General: Abdomen is flat.     Palpations: Abdomen is soft.     Tenderness: There is abdominal  tenderness in the left lower quadrant. There is no guarding. Negative signs include Murphy's sign and McBurney's sign.  Musculoskeletal:        General: No swelling. Normal range of motion.     Cervical back: Neck supple.  Skin:    General: Skin is warm and dry.  Neurological:     General: No focal deficit present.     Mental Status: She is alert.  Psychiatric:        Mood and Affect: Mood normal.     ED Results / Procedures / Treatments   EKG None  Procedures Procedures  Medications Ordered in the ED Medications  iohexol (OMNIPAQUE) 300 MG/ML solution 100 mL (100 mLs Intravenous Contrast Given 02/07/22 0532)    Initial Impression and Plan  Patient with LLQ abdominal pain. Abdomen is benign. Labs done in triage show normal CBC, CMP and lipase. UA is clear. Will send for CT to eval diverticulitis or other acute process.   ED Course   Clinical Course as of 02/07/22 5053  Sat Feb 07, 2022  0633 I personally viewed the images from radiology studies and agree with radiologist interpretation: CT is negative for acute process. Patient continues to rest comfortably. Recommend she try OTC laxative for possible constipation given her decreased stool output. PCP follow up. RTED for  any other concerns.  [CS]    Clinical Course User Index [CS] Truddie Hidden, MD     MDM Rules/Calculators/A&P Medical Decision Making Problems Addressed: Left lower quadrant abdominal pain: acute illness or injury  Amount and/or Complexity of Data Reviewed Labs: ordered. Decision-making details documented in ED Course. Radiology: ordered and independent interpretation performed. Decision-making details documented in ED Course.  Risk Prescription drug management.    Final Clinical Impression(s) / ED Diagnoses Final diagnoses:  Left lower quadrant abdominal pain    Rx / DC Orders ED Discharge Orders     None        Truddie Hidden, MD 02/07/22 [phone]

## 2022-02-09 ENCOUNTER — Telehealth: Payer: Self-pay

## 2022-02-09 NOTE — Telephone Encounter (Signed)
I called pt. Per pt. Ping report she was recently in the hospital for LLQ pain. She stated she is still having some pain on that side but she would call back to schedule a f/u because she needs to get a ride.

## 2022-04-09 ENCOUNTER — Ambulatory Visit (INDEPENDENT_AMBULATORY_CARE_PROVIDER_SITE_OTHER): Payer: Medicare Other | Admitting: Medical

## 2022-04-09 VITALS — BP 138/98 | HR 96 | Wt 199.6 lb

## 2022-04-09 DIAGNOSIS — I10 Essential (primary) hypertension: Secondary | ICD-10-CM | POA: Diagnosis not present

## 2022-04-09 DIAGNOSIS — M65332 Trigger finger, left middle finger: Secondary | ICD-10-CM | POA: Diagnosis not present

## 2022-04-09 DIAGNOSIS — N189 Chronic kidney disease, unspecified: Secondary | ICD-10-CM

## 2022-04-09 DIAGNOSIS — M25441 Effusion, right hand: Secondary | ICD-10-CM | POA: Diagnosis not present

## 2022-04-09 MED ORDER — VALSARTAN-HYDROCHLOROTHIAZIDE 160-12.5 MG PO TABS: 1.0000 | ORAL_TABLET | Freq: Every day | ORAL | 2 refills | Status: DC

## 2022-04-09 NOTE — Progress Notes (Signed)
Subjective:  Kelly Mccann is a 76 y.o. female who presents for Chief Complaint  Patient presents with   middle finger swollen    Middle finger swollen on left hand x 1 month, declines flu shot     Here for a few concerns  HTN - not currently taking Edarbychlor.  Quit taking this last year at some point.  Felt like it made heart swell or breast swell.  She is using holistic approach to BP, was thinking the BP would be lower. Had been on BP medication since 1980s.    Was on lovastatin but stopped this too to try things holistically.  For the past month her left middle finger has been swollen.  No injury no specific pain to swelling.  She does feel like the finger locks up at times.  No other aggravating or relieving factors.    No other c/o.  Past Medical History:  Diagnosis Date   Arthritis    Asthma    Atherosclerosis    Hx of adenomatous colonic polyps 04/09/2016   Hypercholesterolemia    Hypertension    Osteopenia determined by x-ray    Prediabetes    No current outpatient medications on file prior to visit.   No current facility-administered medications on file prior to visit.     The following portions of the patient's history were reviewed and updated as appropriate: allergies, current medications, past family history, past medical history, past social history, past surgical history and problem list.  ROS Otherwise as in subjective above    Objective: BP (!) 150/100   Pulse 96   Wt 199 lb 9.6 oz (90.5 kg)   BMI 31.73 kg/m   General appearance: alert, no distress, well developed, well nourished Neck: supple, no lymphadenopathy, no thyromegaly, no masses Heart: RRR, normal S1, S2, no murmurs Lungs: CTA bilaterally, no wheezes, rhonchi, or rales MSK: Left middle finger with generalized swelling mildly, no obvious deformity otherwise, slightly tender over the third MCP and there appears to be may be a bone spur on the volar aspect of the distal metacarpal of  the third finger causing some trigger finger action, wrist and hand unremarkable, rest of wrist unremarkable no swelling or pain pulses: 2+ radial pulses, 2+ pedal pulses, normal cap refill Ext: no edema   Assessment: Encounter Diagnoses  Name Primary?   Primary hypertension Yes   Chronic kidney disease, unspecified CKD stage    Finger joint swelling, right    Trigger middle finger of left hand      Plan: Hypertension We discussed risk of uncontrolled high blood pressure Begin trial of valsartan HCT once daily Hydrate with at least 80 ounces of water daily Limit salt Recheck in 5-6 weeks  Chronic kidney disease Hydrate well daily Due to abnormal kidney function, and in order to protect your kidneys, I recommend you avoid medications that can harm the kidneys such as ibuprofen, Aleve, Advil, Motrin, Naprosyn, or prescription anti-inflammatories which are used for pain, inflammation, and arthritis.   You should avoid dehydration which can harm the kidneys.  Trigger finger  Try buddy taping the middle finger to the ring finger daily Use cool therapy and elevation at least once daily for the next week You can use topical Aspercreme if needed for pain You can use over the counter Tylenol for pain as needed  You declined flu shot today   Kelly Mccann was seen today for middle finger swollen.  Diagnoses and all orders for this visit:  Primary  hypertension  Chronic kidney disease, unspecified CKD stage  Finger joint swelling, right  Trigger middle finger of left hand  Other orders -     valsartan-hydrochlorothiazide (DIOVAN-HCT) 160-12.5 MG tablet; Take 1 tablet by mouth daily.    Follow up: 5-6 weeks

## 2022-04-09 NOTE — Patient Instructions (Signed)
Hypertension We discussed risk of uncontrolled high blood pressure Begin trial of valsartan HCT once daily Hydrate with at least 80 ounces of water daily Limit salt Recheck in 5-6 weeks  Chronic kidney disease Hydrate well daily Due to abnormal kidney function, and in order to protect your kidneys, I recommend you avoid medications that can harm the kidneys such as ibuprofen, Aleve, Advil, Motrin, Naprosyn, or prescription anti-inflammatories which are used for pain, inflammation, and arthritis.   You should avoid dehydration which can harm the kidneys.  Trigger finger  Try buddy taping the middle finger to the ring finger daily Use cool therapy and elevation at least once daily for the next week You can use topical Aspercreme if needed for pain You can use over the counter Tylenol for pain as needed  You declined flu shot today

## 2022-09-10 ENCOUNTER — Telehealth: Payer: Self-pay | Admitting: Family Medicine

## 2022-09-10 NOTE — Telephone Encounter (Signed)
Left message for patient to call back and schedule Medicare Annual Wellness Visit (AWV) either virtually or in office. Left  my Kelly Mccann number [phone]   Last AWV 09/12/21 please schedule with Nurse Health Adviser   45 min for awv-i  in office appointments 30 min for awv-s & awv-i phone/virtual appointments

## 2022-09-22 ENCOUNTER — Ambulatory Visit (INDEPENDENT_AMBULATORY_CARE_PROVIDER_SITE_OTHER): Payer: Medicare Other

## 2022-09-22 VITALS — Ht 66.5 in | Wt 199.0 lb

## 2022-09-22 DIAGNOSIS — Z0000 Encounter for general adult medical examination without abnormal findings: Secondary | ICD-10-CM | POA: Diagnosis not present

## 2022-09-22 NOTE — Patient Instructions (Signed)
Kelly Mccann , Thank you for taking time to come for your Medicare Wellness Visit. I appreciate your ongoing commitment to your health goals. Please review the following plan we discussed and let me know if I can assist you in the future.   These are the goals we discussed:  Goals      Patient Stated     09/12/2021, wants to lose weight     Patient Stated     09/22/2022, maintain current health        This is a list of the screening recommended for you and due dates:  Health Maintenance  Topic Date Due   DTaP/Tdap/Td vaccine (1 - Tdap) Never done   Zoster (Shingles) Vaccine (1 of 2) Never done   Pneumonia Vaccine (2 of 2 - PPSV23 or PCV20) 12/03/2017   Medicare Annual Wellness Visit  09/23/2023   Hepatitis C Screening: USPSTF Recommendation to screen - Ages 18-79 yo.  Completed   HPV Vaccine  Aged Out   Flu Shot  Discontinued   Colon Cancer Screening  Discontinued   COVID-19 Vaccine  Discontinued    Advanced directives: copy in chart  Conditions/risks identified: none  Next appointment: Follow up in one year for your annual wellness visit    Preventive Care 65 Years and Older, Female Preventive care refers to lifestyle choices and visits with your health care provider that can promote health and wellness. What does preventive care include? A yearly physical exam. This is also called an annual well check. Dental exams once or twice a year. Routine eye exams. Ask your health care provider how often you should have your eyes checked. Personal lifestyle choices, including: Daily care of your teeth and gums. Regular physical activity. Eating a healthy diet. Avoiding tobacco and drug use. Limiting alcohol use. Practicing safe sex. Taking low-dose aspirin every day. Taking vitamin and mineral supplements as recommended by your health care provider. What happens during an annual well check? The services and screenings done by your health care provider during your annual well  check will depend on your age, overall health, lifestyle risk factors, and family history of disease. Counseling  Your health care provider may ask you questions about your: Alcohol use. Tobacco use. Drug use. Emotional well-being. Home and relationship well-being. Sexual activity. Eating habits. History of falls. Memory and ability to understand (cognition). Work and work Statistician. Reproductive health. Screening  You may have the following tests or measurements: Height, weight, and BMI. Blood pressure. Lipid and cholesterol levels. These may be checked every 5 years, or more frequently if you are over 63 years old. Skin check. Lung cancer screening. You may have this screening every year starting at age 46 if you have a 30-pack-year history of smoking and currently smoke or have quit within the past 15 years. Fecal occult blood test (FOBT) of the stool. You may have this test every year starting at age 59. Flexible sigmoidoscopy or colonoscopy. You may have a sigmoidoscopy every 5 years or a colonoscopy every 10 years starting at age 50. Hepatitis C blood test. Hepatitis B blood test. Sexually transmitted disease (STD) testing. Diabetes screening. This is done by checking your blood sugar (glucose) after you have not eaten for a while (fasting). You may have this done every 1-3 years. Bone density scan. This is done to screen for osteoporosis. You may have this done starting at age 51. Mammogram. This may be done every 1-2 years. Talk to your health care provider about how  often you should have regular mammograms. Talk with your health care provider about your test results, treatment options, and if necessary, the need for more tests. Vaccines  Your health care provider may recommend certain vaccines, such as: Influenza vaccine. This is recommended every year. Tetanus, diphtheria, and acellular pertussis (Tdap, Td) vaccine. You may need a Td booster every 10 years. Zoster  vaccine. You may need this after age 70. Pneumococcal 13-valent conjugate (PCV13) vaccine. One dose is recommended after age 28. Pneumococcal polysaccharide (PPSV23) vaccine. One dose is recommended after age 27. Talk to your health care provider about which screenings and vaccines you need and how often you need them. This information is not intended to replace advice given to you by your health care provider. Make sure you discuss any questions you have with your health care provider. Document Released: 08/16/2015 Document Revised: 04/08/2016 Document Reviewed: 05/21/2015 Elsevier Interactive Patient Education  2017 [employer] Prevention in the Home Falls can cause injuries. They can happen to people of all ages. There are many things you can do to make your home safe and to help prevent falls. What can I do on the outside of my home? Regularly fix the edges of walkways and driveways and fix any cracks. Remove anything that might make you trip as you walk through a door, such as a raised step or threshold. Trim any bushes or trees on the path to your home. Use bright outdoor lighting. Clear any walking paths of anything that might make someone trip, such as rocks or tools. Regularly check to see if handrails are loose or broken. Make sure that both sides of any steps have handrails. Any raised decks and porches should have guardrails on the edges. Have any leaves, snow, or ice cleared regularly. Use sand or salt on walking paths during winter. Clean up any spills in your garage right away. This includes oil or grease spills. What can I do in the bathroom? Use night lights. Install grab bars by the toilet and in the tub and shower. Do not use towel bars as grab bars. Use non-skid mats or decals in the tub or shower. If you need to sit down in the shower, use a plastic, non-slip stool. Keep the floor dry. Clean up any water that spills on the floor as soon as it happens. Remove  soap buildup in the tub or shower regularly. Attach bath mats securely with double-sided non-slip rug tape. Do not have throw rugs and other things on the floor that can make you trip. What can I do in the bedroom? Use night lights. Make sure that you have a light by your bed that is easy to reach. Do not use any sheets or blankets that are too big for your bed. They should not hang down onto the floor. Have a firm chair that has side arms. You can use this for support while you get dressed. Do not have throw rugs and other things on the floor that can make you trip. What can I do in the kitchen? Clean up any spills right away. Avoid walking on wet floors. Keep items that you use a lot in easy-to-reach places. If you need to reach something above you, use a strong step stool that has a grab bar. Keep electrical cords out of the way. Do not use floor polish or wax that makes floors slippery. If you must use wax, use non-skid floor wax. Do not have throw rugs and other things  on the floor that can make you trip. What can I do with my stairs? Do not leave any items on the stairs. Make sure that there are handrails on both sides of the stairs and use them. Fix handrails that are broken or loose. Make sure that handrails are as long as the stairways. Check any carpeting to make sure that it is firmly attached to the stairs. Fix any carpet that is loose or worn. Avoid having throw rugs at the top or bottom of the stairs. If you do have throw rugs, attach them to the floor with carpet tape. Make sure that you have a light switch at the top of the stairs and the bottom of the stairs. If you do not have them, ask someone to add them for you. What else can I do to help prevent falls? Wear shoes that: Do not have high heels. Have rubber bottoms. Are comfortable and fit you well. Are closed at the toe. Do not wear sandals. If you use a stepladder: Make sure that it is fully opened. Do not climb a  closed stepladder. Make sure that both sides of the stepladder are locked into place. Ask someone to hold it for you, if possible. Clearly mark and make sure that you can see: Any grab bars or handrails. First and last steps. Where the edge of each step is. Use tools that help you move around (mobility aids) if they are needed. These include: Canes. Walkers. Scooters. Crutches. Turn on the lights when you go into a dark area. Replace any light bulbs as soon as they burn out. Set up your furniture so you have a clear path. Avoid moving your furniture around. If any of your floors are uneven, fix them. If there are any pets around you, be aware of where they are. Review your medicines with your doctor. Some medicines can make you feel dizzy. This can increase your chance of falling. Ask your doctor what other things that you can do to help prevent falls. This information is not intended to replace advice given to you by your health care provider. Make sure you discuss any questions you have with your health care provider. Document Released: 05/16/2009 Document Revised: 12/26/2015 Document Reviewed: 08/24/2014 Elsevier Interactive Patient Education  2017 [employer].

## 2022-09-22 NOTE — Progress Notes (Signed)
I connected with  Toy Cookey on 09/22/22 by a audio enabled telemedicine application and verified that I am speaking with the correct person using two identifiers.  Patient Location: Home  Provider Location: Office/Clinic  I discussed the limitations of evaluation and management by telemedicine. The patient expressed understanding and agreed to proceed.  Subjective:   Kelly Mccann is a 77 y.o. female who presents for Medicare Annual (Subsequent) preventive examination.  Review of Systems     Cardiac Risk Factors include: advanced age (>15mn, >>21women);hypertension;obesity (BMI >30kg/m2)     Objective:    Today's Vitals   09/22/22 0846  Weight: 199 lb (90.3 kg)  Height: 5' 6.5" (1.689 m)   Body mass index is 31.64 kg/m.     09/22/2022    8:49 AM 02/06/2022   10:28 PM 09/12/2021    1:46 PM 09/12/2021    1:45 PM 09/15/2016    9:25 PM 03/14/2016    1:15 PM 08/18/2015    9:58 AM  Advanced Directives  Does Patient Have a Medical Advance Directive? Yes No Yes Yes No No No  Type of [place]  Out of facility DNR (pink MOST or yellow form) [place]     Copy of [place]? Yes - validated most recent copy scanned in chart (See row information)   Yes - validated most recent copy scanned in chart (See row information)     Would patient like information on creating a medical advance directive?     Yes (MAU/Ambulatory/Procedural Areas - Information given) Yes - Educational materials given     Current Medications (verified) Outpatient Encounter Medications as of 09/22/2022  Medication Sig   valsartan-hydrochlorothiazide (DIOVAN-HCT) 160-12.5 MG tablet Take 1 tablet by mouth daily. (Patient not taking: Reported on 09/22/2022)   No facility-administered encounter medications on file as of 09/22/2022.    Allergies (verified) Patient has no known allergies.   History: Past  Medical History:  Diagnosis Date   Arthritis    Asthma    Atherosclerosis    Hx of adenomatous colonic polyps 04/09/2016   Hypercholesterolemia    Hypertension    Osteopenia determined by x-ray    Prediabetes    Past Surgical History:  Procedure Laterality Date   BREAST EXCISIONAL BIOPSY  2004   PARTIAL HYSTERECTOMY  1990   Family History  Problem Relation Age of Onset   Alzheimer's disease Mother    Heart disease Father 452  Leukemia Maternal Aunt    Asthma Maternal Grandmother    Stroke Brother 65      half brother   Cancer Brother    Social History   Socioeconomic History   Marital status: Widowed    Spouse name: Not on file   Number of children: 3   Years of education: Not on file   Highest education level: Not on file  Occupational History   Occupation: retired  Tobacco Use   Smoking status: Former    Packs/day: 0.25    Years: 32.00    Total pack years: 8.00    Types: Cigarettes    Quit date: 08/18/1999    Years since quitting: 23.1    Passive exposure: Never   Smokeless tobacco: Never  Vaping Use   Vaping Use: Never used  Substance and Sexual Activity   Alcohol use: Not Currently    Comment: occasional red wine   Drug use: No  Sexual activity: Never  Other Topics Concern   Not on file  Social History Narrative   Not on file   Social Determinants of Health   Financial Resource Strain: Low Risk  (09/22/2022)   Overall Financial Resource Strain (CARDIA)    Difficulty of Paying Living Expenses: Not hard at all  Food Insecurity: No Food Insecurity (09/22/2022)   Hunger Vital Sign    Worried About Running Out of Food in the Last Year: Never true    Ran Out of Food in the Last Year: Never true  Transportation Needs: No Transportation Needs (09/22/2022)   PRAPARE - Hydrologist (Medical): No    Lack of Transportation (Non-Medical): No  Physical Activity: Inactive (09/22/2022)   Exercise Vital Sign    Days of Exercise per  Week: 0 days    Minutes of Exercise per Session: 0 min  Stress: No Stress Concern Present (09/22/2022)   [place]    Feeling of Stress : Only a little  Social Connections: Not on file    Tobacco Counseling Counseling given: Not Answered   Clinical Intake:  Pre-visit preparation completed: Yes  Pain : No/denies pain     Nutritional Risks: None Diabetes: No  How often do you need to have someone help you when you read instructions, pamphlets, or other written materials from your doctor or pharmacy?: 1 - Never  Diabetic? no  Interpreter Needed?: No  Information entered by :: NAllen LPN   Activities of Daily Living    09/22/2022    8:50 AM  In your present state of health, do you have any difficulty performing the following activities:  Hearing? 0  Vision? 0  Difficulty concentrating or making decisions? 0  Walking or climbing stairs? 0  Dressing or bathing? 0  Doing errands, shopping? 0  Preparing Food and eating ? N  Using the Toilet? N  In the past six months, have you accidently leaked urine? N  Do you have problems with loss of bowel control? N  Managing your Medications? N  Managing your Finances? N  Housekeeping or managing your Housekeeping? N    Patient Care Team: Girtha Rm, NP-C as PCP - General (Family Medicine)  Indicate any recent Medical Services you may have received from other than [facility] providers in the past year (date may be approximate).     Assessment:   This is a routine wellness examination for Kelly Mccann.  Hearing/Vision screen Vision Screening - Comments:: No regular eye exams,  Dietary issues and exercise activities discussed: Current Exercise Habits: The patient does not participate in regular exercise at present   Goals Addressed             This Visit's Progress    Patient Stated       09/22/2022, maintain current health       Depression Screen     09/22/2022    8:50 AM 04/09/2022    2:05 PM 09/12/2021    1:48 PM 12/06/2020    9:24 AM 06/11/2020   10:58 AM 12/06/2019    9:39 AM 08/08/2019    9:10 AM  PHQ 2/9 Scores  PHQ - 2 Score 0 0 0 3 0 0 0  PHQ- 9 Score    4       Fall Risk    09/22/2022    8:50 AM 04/09/2022    2:05 PM 09/12/2021    1:48 PM 12/06/2020  9:24 AM 06/11/2020   10:58 AM  Fall Risk   Falls in the past year? 0 0 0 0 0  Number falls in past yr: 0 0  0 0  Injury with Fall? 0 0   0  Risk for fall due to : No Fall Risks No Fall Risks No Fall Risks No Fall Risks   Follow up Falls prevention discussed;Education provided;Falls evaluation completed Falls evaluation completed Falls evaluation completed;Education provided;Falls prevention discussed Falls evaluation completed     FALL RISK PREVENTION PERTAINING TO THE HOME:  Any stairs in or around the home? Yes  If so, are there any without handrails? No  Home free of loose throw rugs in walkways, pet beds, electrical cords, etc? Yes  Adequate lighting in your home to reduce risk of falls? Yes   ASSISTIVE DEVICES UTILIZED TO PREVENT FALLS:  Life alert? No  Use of a cane, walker or w/c? No  Grab bars in the bathroom? Yes  Shower chair or bench in shower? No  Elevated toilet seat or a handicapped toilet? Yes   TIMED UP AND GO:  Was the test performed? No .      Cognitive Function:        09/22/2022    8:52 AM  6CIT Screen  What Year? 0 points  What month? 0 points  What time? 0 points  Count back from 20 0 points  Months in reverse 0 points  Repeat phrase 0 points  Total Score 0 points    Immunizations Immunization History  Administered Date(s) Administered   Pneumococcal Conjugate-13 12/03/2016    TDAP status: Due, Education has been provided regarding the importance of this vaccine. Advised may receive this vaccine at local pharmacy or Health Dept. Aware to provide a copy of the vaccination record if obtained from local pharmacy or Health Dept.  Verbalized acceptance and understanding.  Flu Vaccine status: Declined, Education has been provided regarding the importance of this vaccine but patient still declined. Advised may receive this vaccine at local pharmacy or Health Dept. Aware to provide a copy of the vaccination record if obtained from local pharmacy or Health Dept. Verbalized acceptance and understanding.  Pneumococcal vaccine status: Up to date  Covid-19 vaccine status: Declined, Education has been provided regarding the importance of this vaccine but patient still declined. Advised may receive this vaccine at local pharmacy or Health Dept.or vaccine clinic. Aware to provide a copy of the vaccination record if obtained from local pharmacy or Health Dept. Verbalized acceptance and understanding.  Qualifies for Shingles Vaccine? Yes   Zostavax completed No   Shingrix Completed?: No.    Education has been provided regarding the importance of this vaccine. Patient has been advised to call insurance company to determine out of pocket expense if they have not yet received this vaccine. Advised may also receive vaccine at local pharmacy or Health Dept. Verbalized acceptance and understanding.  Screening Tests Health Maintenance  Topic Date Due   DTaP/Tdap/Td (1 - Tdap) Never done   Zoster Vaccines- Shingrix (1 of 2) Never done   Pneumonia Vaccine 62+ Years old (2 of 2 - PPSV23 or PCV20) 12/03/2017   Medicare Annual Wellness (AWV)  09/12/2022   Hepatitis C Screening  Completed   HPV VACCINES  Aged Out   INFLUENZA VACCINE  Discontinued   COLONOSCOPY (Pts 45-14yr Insurance coverage will need to be confirmed)  Discontinued   COVID-19 Vaccine  Discontinued    Health Maintenance  Health Maintenance Due  Topic  Date Due   DTaP/Tdap/Td (1 - Tdap) Never done   Zoster Vaccines- Shingrix (1 of 2) Never done   Pneumonia Vaccine 76+ Years old (2 of 2 - PPSV23 or PCV20) 12/03/2017   Medicare Annual Wellness (AWV)  09/12/2022     Colorectal cancer screening: decline   Mammogram status: decline  Bone Density status: n/a  Lung Cancer Screening: (Low Dose CT Chest recommended if Age 54-80 years, 30 pack-year currently smoking OR have quit w/in 15years.) does not qualify.   Lung Cancer Screening Referral: no  Additional Screening:  Hepatitis C Screening: does qualify; Completed 12/24/2016  Vision Screening: Recommended annual ophthalmology exams for early detection of glaucoma and other disorders of the eye. Is the patient up to date with their annual eye exam?  No  Who is the provider or what is the name of the office in which the patient attends annual eye exams? no If pt is not established with a provider, would they like to be referred to a provider to establish care? No .   Dental Screening: Recommended annual dental exams for proper oral hygiene  Community Resource Referral / Chronic Care Management: CRR required this visit?  No   CCM required this visit?  No      Plan:     I have personally reviewed and noted the following in the patient's chart:   Medical and social history Use of alcohol, tobacco or illicit drugs  Current medications and supplements including opioid prescriptions. Patient is not currently taking opioid prescriptions. Functional ability and status Nutritional status Physical activity Advanced directives List of other physicians Hospitalizations, surgeries, and ER visits in previous 12 months Vitals Screenings to include cognitive, depression, and falls Referrals and appointments  In addition, I have reviewed and discussed with patient certain preventive protocols, quality metrics, and best practice recommendations. A written personalized care plan for preventive services as well as general preventive health recommendations were provided to patient.     Kellie Simmering, LPN   [id number]   Nurse Notes: none  Due to this being a virtual visit, the after visit summary  with patients personalized plan was offered to patient via mail or my-chart.  to pick up at office at next visit

## 2023-09-01 ENCOUNTER — Ambulatory Visit: Payer: Medicare Other | Admitting: Medical

## 2023-09-01 VITALS — BP 132/82 | HR 98 | Wt 196.6 lb

## 2023-09-01 DIAGNOSIS — R0902 Hypoxemia: Secondary | ICD-10-CM

## 2023-09-01 DIAGNOSIS — J45901 Unspecified asthma with (acute) exacerbation: Secondary | ICD-10-CM | POA: Diagnosis not present

## 2023-09-01 DIAGNOSIS — Z87898 Personal history of other specified conditions: Secondary | ICD-10-CM

## 2023-09-01 DIAGNOSIS — R051 Acute cough: Secondary | ICD-10-CM

## 2023-09-01 MED ORDER — ALBUTEROL SULFATE (2.5 MG/3ML) 0.083% IN NEBU: 2.5000 mg | INHALATION_SOLUTION | Freq: Four times a day (QID) | RESPIRATORY_TRACT | 1 refills | Status: DC | PRN

## 2023-09-01 MED ORDER — BUDESONIDE-FORMOTEROL FUMARATE 160-4.5 MCG/ACT IN AERO: 2.0000 | INHALATION_SPRAY | Freq: Two times a day (BID) | RESPIRATORY_TRACT | 3 refills | Status: DC

## 2023-09-01 MED ORDER — ALBUTEROL SULFATE (2.5 MG/3ML) 0.083% IN NEBU
2.5000 mg | INHALATION_SOLUTION | Freq: Once | RESPIRATORY_TRACT | Status: AC
  Administered 2023-09-01: 2.5 mg via RESPIRATORY_TRACT

## 2023-09-01 MED ORDER — ALBUTEROL SULFATE HFA 108 (90 BASE) MCG/ACT IN AERS: 2.0000 | INHALATION_SPRAY | Freq: Four times a day (QID) | RESPIRATORY_TRACT | 0 refills | Status: DC | PRN

## 2023-09-01 NOTE — Addendum Note (Signed)
Addended by: Herminio Commons A on: 09/01/2023 03:19 PM   Modules accepted: Orders

## 2023-09-01 NOTE — Progress Notes (Signed)
Subjective:  Kelly Mccann is a 78 y.o. female who presents for Chief Complaint  Patient presents with   cough    Deep cough, thought she had flu last week but her cough won't go away. Difficulty breathing, wheezing     Here for cough.  She notes feeling sick last week starting about 08/23/23.  She thinks she had the flu last week.  Initially had body aches, chills, fever, flu like symptoms, head pressure.   Now has residual symptoms that wont go away.  Currently has cough, wheeze in throat, not sleeping well due to cough.   Sometimes getting phlegm.    Has hx/o asthma.  Nonsmoker.   Using proair BID.     Not on oxygen and has never been on oxygen.    Been on Symbicort but that was a few years ago.    No other aggravating or relieving factors.    No other c/o.  Past Medical History:  Diagnosis Date   Arthritis    Asthma    Atherosclerosis    Hx of adenomatous colonic polyps 04/09/2016   Hypercholesterolemia    Hypertension    Osteopenia determined by x-ray    Prediabetes    Current Outpatient Medications on File Prior to Visit  Medication Sig Dispense Refill   albuterol (VENTOLIN HFA) 108 (90 Base) MCG/ACT inhaler Inhale 2 puffs into the lungs every 6 (six) hours as needed for wheezing or shortness of breath.     valsartan-hydrochlorothiazide (DIOVAN-HCT) 160-12.5 MG tablet Take 1 tablet by mouth daily. 30 tablet 2   No current facility-administered medications on file prior to visit.     The following portions of the patient's history were reviewed and updated as appropriate: allergies, current medications, past family history, past medical history, past social history, past surgical history and problem list.  ROS Otherwise as in subjective above    Objective: BP 132/82   Pulse 98   Wt 196 lb 9.6 oz (89.2 kg)   SpO2 (!) 88%   BMI 31.26 kg/m   General appearance: alert, no distress, well developed, well nourished HEENT: normocephalic, sclerae anicteric,  conjunctiva pink and moist, TMs pearly, nares patent, no discharge or erythema, pharynx normal Oral cavity: MMM, no lesions Neck: supple, no lymphadenopathy, no thyromegaly, no masses Heart: RRR, normal S1, S2, no murmurs Lungs: diffuse wheezes, few rhonchi, no rales Pulses: 2+ radial pulses, 2+ pedal pulses, normal cap refill Ext: no edema   Assessment: Encounter Diagnoses  Name Primary?   Moderate asthma with exacerbation, unspecified whether persistent Yes   Acute cough    History of acute illness    Hypoxemia      Plan: After initial triage and eval, started 1 round of nebulized albuterol.  She did respond with good improvement with a round of nebulizer.  Pulse ox improved to 93%  Recommended steroid injection with prednisone today but she declines.  She does not really want to do a steroid  Begin back on maintenance preventative inhaler.  Discussed proper use of Symbicort, rinse mouth, water, continue albuterol either handheld inhaler or nebulized albuterol as needed.  She has brand ProAir.  Rest, hydrate well, and if not seeing significant improvement over the next 48 hours then I would strongly recommend adding steroid.  She will let me know.  Symptoms suggest recent influenza that flared up her asthma.  No indication for antibiotics today.  Lung fields were much improved after round of nebulized albuterol  Kelly Mccann was seen today  for cough.  Diagnoses and all orders for this visit:  Moderate asthma with exacerbation, unspecified whether persistent -     Pulse oximetry (single); Future  Acute cough -     Pulse oximetry (single); Future  History of acute illness -     Pulse oximetry (single); Future  Hypoxemia -     Pulse oximetry (single); Future  Other orders -     budesonide-formoterol (SYMBICORT) 160-4.5 MCG/ACT inhaler; Inhale 2 puffs into the lungs 2 (two) times daily. -     albuterol (VENTOLIN HFA) 108 (90 Base) MCG/ACT inhaler; Inhale 2 puffs into the lungs  every 6 (six) hours as needed for wheezing or shortness of breath. -     albuterol (PROVENTIL) (2.5 MG/3ML) 0.083% nebulizer solution; Take 3 mLs (2.5 mg total) by nebulization every 6 (six) hours as needed for wheezing or shortness of breath.    Follow up: call /recheck 2-4 days

## 2023-09-11 ENCOUNTER — Other Ambulatory Visit: Payer: Self-pay | Admitting: Medical

## 2023-09-24 ENCOUNTER — Other Ambulatory Visit: Payer: Self-pay | Admitting: Medical

## 2023-10-17 ENCOUNTER — Other Ambulatory Visit: Payer: Self-pay | Admitting: Medical

## 2023-10-19 ENCOUNTER — Ambulatory Visit: Admitting: Medical

## 2023-11-04 ENCOUNTER — Other Ambulatory Visit: Payer: Self-pay | Admitting: Medical

## 2023-11-04 NOTE — Telephone Encounter (Signed)
 Existing Refills.

## 2023-11-04 NOTE — Telephone Encounter (Signed)
 Copied from CRM [id number]. Topic: Clinical - Medication Refill >> Nov 04, 2023 12:44 PM DeAngela L wrote: Most Recent Primary Care Visit:  Provider: Jac Canavan  Department: [facility]  Visit Type: ACUTE  Date: 09/01/2023  Medication: valsartan-hydrochlorothiazide (DIOVAN-HCT) 160-12.5 MG tablet   Has the patient contacted their pharmacy? Yes  (Agent: If no, request that the patient contact the pharmacy for the refill. If patient does not wish to contact the pharmacy document the reason why and proceed with request.) (Agent: If yes, when and what did the pharmacy advise?)  Is this the correct pharmacy for this prescription? Yes  If no, delete pharmacy and type the correct one.  This is the patient's preferred pharmacy:   [facility] [phone] - [place], [place] - [address] AT [facility] [address] [place] [address] Phone: [phone] Fax: [phone]   Has the prescription been filled recently? Yes   Is the patient out of the medication? No   Has the patient been seen for an appointment in the last year OR does the patient have an upcoming appointment? Yes   Can we respond through MyChart? No   Agent: Please be advised that Rx refills may take up to 3 business days. We ask that you follow-up with your pharmacy.

## 2023-11-05 ENCOUNTER — Other Ambulatory Visit: Payer: Self-pay | Admitting: Medical

## 2023-11-05 NOTE — Telephone Encounter (Signed)
 Patient is overdue for a visit with Vincenza Hews. Please schedule. I will send in medication for 30 days

## 2023-11-05 NOTE — Telephone Encounter (Signed)
 Schedule pt for cpe on 12/01/23

## 2023-12-01 ENCOUNTER — Ambulatory Visit: Admitting: Medical

## 2023-12-01 ENCOUNTER — Encounter: Payer: Self-pay | Admitting: Medical

## 2023-12-01 VITALS — BP 140/100 | HR 83 | Ht 65.75 in | Wt 201.6 lb

## 2023-12-01 DIAGNOSIS — I10 Essential (primary) hypertension: Secondary | ICD-10-CM | POA: Diagnosis not present

## 2023-12-01 DIAGNOSIS — N189 Chronic kidney disease, unspecified: Secondary | ICD-10-CM

## 2023-12-01 DIAGNOSIS — M8580 Other specified disorders of bone density and structure, unspecified site: Secondary | ICD-10-CM

## 2023-12-01 DIAGNOSIS — Z7185 Encounter for immunization safety counseling: Secondary | ICD-10-CM | POA: Diagnosis not present

## 2023-12-01 DIAGNOSIS — Z1389 Encounter for screening for other disorder: Secondary | ICD-10-CM

## 2023-12-01 DIAGNOSIS — Z136 Encounter for screening for cardiovascular disorders: Secondary | ICD-10-CM | POA: Diagnosis not present

## 2023-12-01 DIAGNOSIS — Z13220 Encounter for screening for lipoid disorders: Secondary | ICD-10-CM | POA: Diagnosis not present

## 2023-12-01 DIAGNOSIS — Z2821 Immunization not carried out because of patient refusal: Secondary | ICD-10-CM

## 2023-12-01 DIAGNOSIS — E781 Pure hyperglyceridemia: Secondary | ICD-10-CM

## 2023-12-01 DIAGNOSIS — J4541 Moderate persistent asthma with (acute) exacerbation: Secondary | ICD-10-CM

## 2023-12-01 DIAGNOSIS — R7303 Prediabetes: Secondary | ICD-10-CM

## 2023-12-01 DIAGNOSIS — Z0000 Encounter for general adult medical examination without abnormal findings: Secondary | ICD-10-CM | POA: Diagnosis not present

## 2023-12-01 DIAGNOSIS — I700 Atherosclerosis of aorta: Secondary | ICD-10-CM

## 2023-12-01 DIAGNOSIS — Z5320 Procedure and treatment not carried out because of patient's decision for unspecified reasons: Secondary | ICD-10-CM

## 2023-12-01 DIAGNOSIS — Z789 Other specified health status: Secondary | ICD-10-CM

## 2023-12-01 LAB — LIPID PANEL

## 2023-12-01 NOTE — Progress Notes (Signed)
 Subjective:   HPI  Kelly Mccann is a 78 y.o. female who presents for Chief Complaint  Patient presents with   Annual Exam    CPE, since starting valsartan , she feels like she can constantly hear her heart beating in her ears. She stopped taking it and it has calm down. Declines Pneumonia Shot    Patient Care Team: Genise Strack, Newt Barefoot as PCP - General (Family Medicine) Dr. Loy Ruff, GI Dr. Vernestine Gondola, pulm   Concerns: She had been out of her blood pressure pills for over a year but then recently started back on it in April.  She felt pulsations in her head when it was quiet so she stopped the medication.  She declines vaccines, colon cancer screening, mammogram, bone density test.   Reviewed their medical, surgical, family, social, medication, and allergy history and updated chart as appropriate.  No Known Allergies  Past Medical History:  Diagnosis Date   Arthritis    Asthma    Atherosclerosis    Hx of adenomatous colonic polyps 04/09/2016   Hypercholesterolemia    Hypertension    Osteopenia determined by x-ray    Prediabetes     No current outpatient medications on file prior to visit.   No current facility-administered medications on file prior to visit.     No current outpatient medications on file.  Family History  Problem Relation Age of Onset   Alzheimer's disease Mother    Heart disease Father 85   Leukemia Maternal Aunt    Asthma Maternal Grandmother    Stroke Brother 41       half brother   Cancer Brother     Past Surgical History:  Procedure Laterality Date   BREAST EXCISIONAL BIOPSY  2004   PARTIAL HYSTERECTOMY  1990      Review of Systems  Constitutional:  Negative for chills, fever, malaise/fatigue and weight loss.  HENT:  Negative for congestion, ear pain, hearing loss, sore throat and tinnitus.   Eyes:  Negative for blurred vision, pain and redness.  Respiratory:  Negative for cough, hemoptysis and shortness of breath.    Cardiovascular:  Positive for palpitations. Negative for chest pain, orthopnea, claudication and leg swelling.  Gastrointestinal:  Negative for abdominal pain, blood in stool, constipation, diarrhea, nausea and vomiting.  Genitourinary:  Negative for dysuria, flank pain, frequency, hematuria and urgency.  Musculoskeletal:  Negative for falls, joint pain and myalgias.  Skin:  Negative for itching and rash.  Neurological:  Negative for dizziness, tingling, speech change, weakness and headaches.  Endo/Heme/Allergies:  Negative for polydipsia. Does not bruise/bleed easily.  Psychiatric/Behavioral:  Negative for depression and memory loss. The patient is not nervous/anxious and does not have insomnia.         Objective:  BP (!) 140/100   Pulse 83   Ht 5' 5.75" (1.67 m)   Wt 201 lb 9.6 oz (91.4 kg)   SpO2 100%   BMI 32.79 kg/m   General appearance: alert, no distress, WD/WN, African American female Skin: Scattered black skin tags of bilateral cheeks and orbits nontender lesions noted. HEENT: normocephalic, conjunctiva/corneas normal, sclerae anicteric, PERRLA, EOMi, nares patent, no discharge or erythema, pharynx normal Oral cavity: MMM, tongue normal, teeth with some stain and plaque, missing some molars Neck: supple, no lymphadenopathy, no thyromegaly, no masses, normal ROM, no bruits Chest: non tender, normal shape and expansion Heart: few ectopic beats, RRR, normal S1, S2, no murmurs Lungs: CTA bilaterally, no wheezes, rhonchi, or rales Abdomen: +  bs, soft, non tender, non distended, no masses, no hepatomegaly, no splenomegaly, no bruits Back: non tender, normal ROM, no scoliosis Musculoskeletal: upper extremities non tender, no obvious deformity, normal ROM throughout, lower extremities non tender, no obvious deformity, normal ROM throughout Extremities: no edema, no cyanosis, no clubbing Pulses: 2+ symmetric, upper and lower extremities, normal cap refill Neurological: alert,  oriented x 3, CN2-12 intact, strength normal upper extremities and lower extremities, sensation normal throughout, DTRs 2+ throughout, no cerebellar signs, gait normal Psychiatric: normal affect, behavior normal, pleasant  Breast/gyn/rectal - deferred to gynecology   EKG reviewed showing occasional PVCs,    Assessment and Plan :   Encounter Diagnoses  Name Primary?   Encounter for health maintenance examination in adult Yes   Encounter for lipid screening for cardiovascular disease    Vaccine counseling    Screening for hematuria or proteinuria    Hypertriglyceridemia    Primary hypertension    Chronic kidney disease, unspecified CKD stage    Atherosclerosis of aorta (HCC)    Colonoscopy refused    Statin not tolerated    Osteopenia determined by x-ray    Prediabetes    Moderate persistent asthma with acute exacerbation    Mammogram declined    Immunization refused      This visit was a preventative care visit, also known as wellness visit or routine physical.   Topics typically include healthy lifestyle, diet, exercise, preventative care, vaccinations, sick and well care, proper use of emergency dept and after hours care, as well as other concerns.    Separate significant issues discussed: Hypertension-she has some Edarbiclor at home.  She wants to try this instead of the valsartan  HCT.  I asked her to take 1/2 tablet today and 1/2 tablet tomorrow while we await her lab results  CKD-updated labs today  Atherosclerosis, prior statin intolerance-follow-up pending lab results  Osteopenia-she declines repeat bone density test.  I recommend weightbearing and aerobic exercise.  Recommend 1200 mg of calcium and at least 1000 units of vitamin D  daily  Asthma-no complaints today    General Recommendations: Continue to return yearly for your annual wellness and preventative care visits.  This gives us  a chance to discuss healthy lifestyle, exercise, vaccinations, review your chart  record, and perform screenings where appropriate.  I recommend you see your eye doctor yearly for routine vision care.  I recommend you see your dentist yearly for routine dental care including hygiene visits twice yearly.   Vaccination recommendations were reviewed Immunization History  Administered Date(s) Administered   Pneumococcal Conjugate-13 12/03/2016    She decliens vaccines.  Discussed vaccine recommendations including Shingrix, Tdap, flu, prevnar   Screening for cancer: Colon cancer screening: Due but declines any more colonoscopies   Breast cancer screening: You should perform a self breast exam monthly.   We reviewed recommendations for regular mammograms and breast cancer screening. Last mammogram: declines    Skin cancer screening: Check your skin regularly for new changes, growing lesions, or other lesions of concern Come in for evaluation if you have skin lesions of concern.   Lung cancer screening: If you have a greater than 20 pack year history of tobacco use, then you may qualify for lung cancer screening with a chest CT scan.   Please call your insurance company to inquire about coverage for this test.  Pancreatic cancer: no current screening test is available routinely recommended.  (Risk factors: Smoking, overweight or obese, diabetes, chronic pancreatitis, work Nurse, mental health, Solicitor, 34 year old or greater,  female greater than female, African-American, family history of pancreatic cancer, hereditary breast, ovarian, melanoma, Lynch, Peutz-jeghers).  We currently don't have screenings for other cancers besides breast, cervical, colon, and lung cancers.  If you have a strong family history of cancer or have other cancer screening concerns, please let me know.    Bone health: Get at least 150 minutes of aerobic exercise weekly Get weight bearing exercise at least once weekly Bone density test:  A bone density test is an imaging test that  uses a type of X-ray to measure the amount of calcium and other minerals in your bones. The test may be used to diagnose or screen you for a condition that causes weak or thin bones (osteoporosis), predict your risk for a broken bone (fracture), or determine how well your osteoporosis treatment is working. The bone density test is recommended for females 65 and older, or females or males <65 if certain risk factors such as thyroid  disease, long term use of steroids such as for asthma or rheumatological issues, vitamin D  deficiency, estrogen deficiency, family history of osteoporosis, self or family history of fragility fracture in first degree relative.  Advised bone density test but she declines     Heart health: Get at least 150 minutes of aerobic exercise weekly Limit alcohol It is important to maintain a healthy blood pressure and healthy cholesterol numbers  Heart disease screening: Screening for heart disease includes screening for blood pressure, fasting lipids, glucose/diabetes screening, BMI height to weight ratio, reviewed of smoking status, physical activity, and diet.    Goals include blood pressure 120/80 or less, maintaining a healthy lipid/cholesterol profile, preventing diabetes or keeping diabetes numbers under good control, not smoking or using tobacco products, exercising most days per week or at least 150 minutes per week of exercise, and eating healthy variety of fruits and vegetables, healthy oils, and avoiding unhealthy food choices like fried food, fast food, high sugar and high cholesterol foods.    Other tests may possibly include EKG test, CT coronary calcium score, echocardiogram, exercise treadmill stress test.    Vascular disease screening: For high risk individuals including smokers, diabetes, patients with known heart disease or high blood pressure, kidney disease, and others, screening for vascular disease or atherosclerosis of the arteries is available.   Examples may include carotid ultrasound, abdominal aortic ultrasound, ABI blood flow screening in the legs, thoracic aorta screening.   Medical care options: I recommend you continue to seek care here first for routine care.  We try really hard to have available appointments Monday through Friday daytime hours for sick visits, acute visits, and physicals.  Urgent care should be used for after hours and weekends for significant issues that cannot wait till the next day.  The emergency department should be used for significant potentially life-threatening emergencies.  The emergency department is expensive, can often have long wait times for less significant concerns, so try to utilize primary care, urgent care, or telemedicine when possible to avoid unnecessary trips to the emergency department.  Virtual visits and telemedicine have been introduced since the pandemic started in 2020, and can be convenient ways to receive medical care.  We offer virtual appointments as well to assist you in a variety of options to seek medical care.   Legal Take the time to do a Last Will and Testament, advanced directives including Healthcare Power of Attorney and Living Will documents.  Do not leave your family with burdens that can be handled ahead of time.  Advanced Directives: I recommend you consider completing a Health Care Power of Attorney and Living Will.   These documents respect your wishes and help alleviate burdens on your loved ones if you were to become terminally ill or be in a position to need those documents enforced.    You can complete Advanced Directives yourself, have them notarized, then have copies made for our office, for you and for anybody you feel should have them in safe keeping.  Or, you can have an attorney prepare these documents.   If you haven't updated your Last Will and Testament in a while, it may be worthwhile having an attorney prepare these documents together and save on some  costs.        Vanelope was seen today for annual exam.  Diagnoses and all orders for this visit:  Encounter for health maintenance examination in adult -     Lipid panel -     CBC -     TSH + free T4 -     EKG 12-Lead -     Renal Function Panel -     Hepatic function panel -     Hemoglobin A1c -     Microalbumin/Creatinine Ratio, Urine -     Urinalysis, Routine w reflex microscopic  Encounter for lipid screening for cardiovascular disease -     Lipid panel  Vaccine counseling  Screening for hematuria or proteinuria -     Urinalysis, Routine w reflex microscopic  Hypertriglyceridemia -     Lipid panel  Primary hypertension  Chronic kidney disease, unspecified CKD stage -     Renal Function Panel -     Microalbumin/Creatinine Ratio, Urine -     Urinalysis, Routine w reflex microscopic  Atherosclerosis of aorta (HCC)  Colonoscopy refused  Statin not tolerated  Osteopenia determined by x-ray  Prediabetes -     Hemoglobin A1c  Moderate persistent asthma with acute exacerbation  Mammogram declined  Immunization refused     Follow-up pending labs, yearly for physical

## 2023-12-02 ENCOUNTER — Other Ambulatory Visit: Payer: Self-pay | Admitting: Medical

## 2023-12-02 LAB — HEMOGLOBIN A1C
Est. average glucose Bld gHb Est-mCnc: 117 mg/dL
Hgb A1c MFr Bld: 5.7 % — ABNORMAL HIGH (ref 4.8–5.6)

## 2023-12-02 LAB — LIPID PANEL
Cholesterol, Total: 256 mg/dL — ABNORMAL HIGH (ref 100–199)
HDL: 82 mg/dL (ref >39)
LDL CALC COMMENT:: 3.1 ratio (ref 0.0–4.4)
LDL Chol Calc (NIH): 141 mg/dL — ABNORMAL HIGH (ref 0–99)
Triglycerides: 186 mg/dL — ABNORMAL HIGH (ref 0–149)
VLDL Cholesterol Cal: 33 mg/dL (ref 5–40)

## 2023-12-02 LAB — RENAL FUNCTION PANEL
Albumin: 4.7 g/dL (ref 3.8–4.8)
BUN/Creatinine Ratio: 15 (ref 12–28)
BUN: 15 mg/dL (ref 8–27)
CO2: 26 mmol/L (ref 20–29)
Calcium: 9.6 mg/dL (ref 8.7–10.3)
Chloride: 102 mmol/L (ref 96–106)
Creatinine, Ser: 0.99 mg/dL (ref 0.57–1.00)
Glucose: 88 mg/dL (ref 70–99)
Phosphorus: 3.3 mg/dL (ref 3.0–4.3)
Potassium: 3.6 mmol/L (ref 3.5–5.2)
Sodium: 142 mmol/L (ref 134–144)
eGFR: 59 mL/min/{1.73_m2} — ABNORMAL LOW (ref >59)

## 2023-12-02 LAB — CBC
Hematocrit: 44.6 % (ref 34.0–46.6)
Hemoglobin: 14.5 g/dL (ref 11.1–15.9)
MCH: 30.3 pg (ref 26.6–33.0)
MCHC: 32.5 g/dL (ref 31.5–35.7)
MCV: 93 fL (ref 79–97)
Platelets: 285 10*3/uL (ref 150–450)
RBC: 4.78 x10E6/uL (ref 3.77–5.28)
RDW: 14.5 % (ref 11.7–15.4)
WBC: 5.1 10*3/uL (ref 3.4–10.8)

## 2023-12-02 LAB — MICROALBUMIN / CREATININE URINE RATIO
Creatinine, Urine: 250.9 mg/dL
Microalb/Creat Ratio: 12 mg/g{creat} (ref 0–29)
Microalbumin, Urine: 29.2 ug/mL

## 2023-12-02 LAB — HEPATIC FUNCTION PANEL
ALT: 17 IU/L (ref 0–32)
AST: 20 IU/L (ref 0–40)
Alkaline Phosphatase: 78 IU/L (ref 44–121)
Bilirubin Total: 0.6 mg/dL (ref 0.0–1.2)
Bilirubin, Direct: 0.22 mg/dL (ref 0.00–0.40)
Total Protein: 7.5 g/dL (ref 6.0–8.5)

## 2023-12-02 LAB — TSH+FREE T4
Free T4: 1.05 ng/dL (ref 0.82–1.77)
TSH: 2.52 u[IU]/mL (ref 0.450–4.500)

## 2023-12-02 NOTE — Progress Notes (Signed)
 Results sent through MyChart

## 2023-12-02 NOTE — Progress Notes (Signed)
 Your cholesterol and triglycerides are too high.  This increases your risk of heart disease and stroke  Electrolytes and kidney stable  Diabetes screening at risk for diabetes but stable at 5.7%  Blood counts normal, thyroid  normal, liver test normal  Still pending microalbumin kidney marker  Start your Edarbychlor 40/25mg  bottles that you already have at home.   Start at 1/2 tablet daily for the first 5 days.  Monitor your blood pressures.  If you are doing okay after 5 days then go up to a whole tablet daily  I strongly recommend you add back cholesterol medicine to lower your cholesterol levels and to prevent future buildup in your arteries as you have prior findings of cholesterol plaques in your coronary arteries that supply the heart muscle as well as your aorta.  Remind me what medicines have you not tolerated prior?  The usual recommendation would be something like Crestor or Lipitor Pravachol statin or a medicine such as Repatha every 2-week injectable.  There is also a drug study for cholesterol medicine currently if you would be interested in a drug study or you can get free study medication and get paid to be part of the study  Drink at least 80 to 100 ounces of water daily

## 2023-12-21 ENCOUNTER — Ambulatory Visit (INDEPENDENT_AMBULATORY_CARE_PROVIDER_SITE_OTHER): Payer: Medicare Other

## 2023-12-21 DIAGNOSIS — Z0000 Encounter for general adult medical examination without abnormal findings: Secondary | ICD-10-CM

## 2023-12-21 NOTE — Progress Notes (Signed)
 Subjective:   Kelly Mccann is a 78 y.o. who presents for a Medicare Wellness preventive visit.  As a reminder, Annual Wellness Visits don't include a physical exam, and some assessments may be limited, especially if this visit is performed virtually. We may recommend an in-person follow-up visit with your provider if needed.  Visit Complete: Virtual I connected with  Abundio Hoit on 12/21/23 by a audio enabled telemedicine application and verified that I am speaking with the correct person using two identifiers.  Patient Location: Home  Provider Location: Office/Clinic  I discussed the limitations of evaluation and management by telemedicine. The patient expressed understanding and agreed to proceed.  Vital Signs: Because this visit was a virtual/telehealth visit, some criteria may be missing or patient reported. Any vitals not documented were not able to be obtained and vitals that have been documented are patient reported.  VideoError- Librarian, academic were attempted between this provider and patient, however failed, due to patient having technical difficulties OR patient did not have access to video capability.  We continued and completed visit with audio only.   Persons Participating in Visit: Patient.  AWV Questionnaire: Yes: Patient Medicare AWV questionnaire was completed by the patient on 12/21/2023; I have confirmed that all information answered by patient is correct and no changes since this date.  Cardiac Risk Factors include: advanced age (>72men, >62 women);hypertension     Objective:     Today's Vitals   There is no height or weight on file to calculate BMI.     12/21/2023    8:49 AM 09/22/2022    8:49 AM 02/06/2022   10:28 PM 09/12/2021    1:46 PM 09/12/2021    1:45 PM 09/15/2016    9:25 PM 03/14/2016    1:15 PM  Advanced Directives  Does Patient Have a Medical Advance Directive? Yes Yes No Yes Yes No No  Type of Sports coach of [place];Living will Healthcare Power of [place];Living will  Out of facility DNR (pink MOST or yellow form) Healthcare Power of [place];Living will    Copy of Healthcare Power of Attorney in Chart? Yes - validated most recent copy scanned in chart (See row information) Yes - validated most recent copy scanned in chart (See row information)   Yes - validated most recent copy scanned in chart (See row information)    Would patient like information on creating a medical advance directive?      Yes (MAU/Ambulatory/Procedural Areas - Information given) Yes - Educational materials given    Current Medications (verified) No outpatient encounter medications on file as of 12/21/2023.   No facility-administered encounter medications on file as of 12/21/2023.    Allergies (verified) Patient has no known allergies.   History: Past Medical History:  Diagnosis Date   Arthritis    Asthma    Atherosclerosis    Hx of adenomatous colonic polyps 04/09/2016   Hypercholesterolemia    Hypertension    Osteopenia determined by x-ray    Prediabetes    Past Surgical History:  Procedure Laterality Date   BREAST EXCISIONAL BIOPSY  2004   PARTIAL HYSTERECTOMY  1990   Family History  Problem Relation Age of Onset   Alzheimer's disease Mother    Heart disease Father 50   Leukemia Maternal Aunt    Asthma Maternal Grandmother    Stroke Brother 58       half brother   Cancer Brother    Social History  Socioeconomic History   Marital status: Widowed    Spouse name: Not on file   Number of children: 3   Years of education: Not on file   Highest education level: Associate degree: academic program  Occupational History   Occupation: retired  Tobacco Use   Smoking status: Former    Current packs/day: 0.00    Average packs/day: 0.3 packs/day for 32.0 years (8.0 ttl pk-yrs)    Types: Cigarettes    Start date: 08/18/1967    Quit date: 08/18/1999    Years since quitting:  24.3    Passive exposure: Never   Smokeless tobacco: Never  Vaping Use   Vaping status: Never Used  Substance and Sexual Activity   Alcohol use: Not Currently    Comment: occasional red wine   Drug use: No   Sexual activity: Never  Other Topics Concern   Not on file  Social History Narrative   Lives with son.    Exercising - sometimes.  Some weight bearing. Sew and gardening.  11/2023.    Social Drivers of Corporate investment banker Strain: Low Risk  (12/21/2023)   Overall Financial Resource Strain (CARDIA)    Difficulty of Paying Living Expenses: Not very hard  Recent Concern: Financial Resource Strain - Medium Risk (12/01/2023)   Overall Financial Resource Strain (CARDIA)    Difficulty of Paying Living Expenses: Somewhat hard  Food Insecurity: No Food Insecurity (12/21/2023)   Hunger Vital Sign    Worried About Running Out of Food in the Last Year: Never true    Ran Out of Food in the Last Year: Never true  Recent Concern: Food Insecurity - Food Insecurity Present (12/01/2023)   Hunger Vital Sign    Worried About Running Out of Food in the Last Year: Sometimes true    Ran Out of Food in the Last Year: Not on file  Transportation Needs: No Transportation Needs (12/21/2023)   PRAPARE - Administrator, Civil Service (Medical): No    Lack of Transportation (Non-Medical): No  Physical Activity: Insufficiently Active (12/21/2023)   Exercise Vital Sign    Days of Exercise per Week: 2 days    Minutes of Exercise per Session: 30 min  Stress: No Stress Concern Present (12/21/2023)   [employer] of Occupational Health - Occupational Stress Questionnaire    Feeling of Stress : Not at all  Social Connections: Moderately Isolated (12/21/2023)   Social Connection and Isolation Panel [NHANES]    Frequency of Communication with Friends and Family: More than three times a week    Frequency of Social Gatherings with Friends and Family: More than three times a week    Attends  Religious Services: 1 to 4 times per year    Active Member of [employer] or Organizations: No    Attends Engineer, structural: Never    Marital Status: Divorced    Tobacco Counseling Counseling given: Not Answered    Clinical Intake:  Pre-visit preparation completed: Yes  Pain : No/denies pain     Nutritional Risks: None Diabetes: No  Lab Results  Component Value Date   HGBA1C 5.7 (H) 12/01/2023   HGBA1C 6.0 (H) 07/09/2020   HGBA1C 5.8 (H) 12/06/2019     How often do you need to have someone help you when you read instructions, pamphlets, or other written materials from your doctor or pharmacy?: 1 - Never  Interpreter Needed?: No  Information entered by :: NAllen LPN   Activities of Daily  Living     12/21/2023    7:20 AM  In your present state of health, do you have any difficulty performing the following activities:  Hearing? 0  Vision? 0  Difficulty concentrating or making decisions? 0  Walking or climbing stairs? 0  Dressing or bathing? 0  Doing errands, shopping? 0  Preparing Food and eating ? N  Using the Toilet? N  In the past six months, have you accidently leaked urine? N  Do you have problems with loss of bowel control? N  Managing your Medications? N  Managing your Finances? N  Housekeeping or managing your Housekeeping? N    Patient Care Team: Tysinger, Christiane Cowing, PA-C as PCP - General (Family Medicine)  Indicate any recent Medical Services you may have received from other than [facility] providers in the past year (date may be approximate).     Assessment:    This is a routine wellness examination for Kelly Mccann.  Hearing/Vision screen Hearing Screening - Comments:: Denies hearing issues Vision Screening - Comments:: No regular eye exams   Goals Addressed             This Visit's Progress    Patient Stated       12/21/2023, wants to lower cholesterol and eating healthy       Depression Screen     12/21/2023    8:52 AM 12/01/2023     4:04 PM 09/22/2022    8:50 AM 04/09/2022    2:05 PM 09/12/2021    1:48 PM 12/06/2020    9:24 AM 06/11/2020   10:58 AM  PHQ 2/9 Scores  PHQ - 2 Score 0 0 0 0 0 3 0  PHQ- 9 Score 0     4     Fall Risk     12/21/2023    7:20 AM 12/01/2023    4:04 PM 09/22/2022    8:50 AM 04/09/2022    2:05 PM 09/12/2021    1:48 PM  Fall Risk   Falls in the past year? 0 0 0 0 0  Number falls in past yr: 0 0 0 0   Injury with Fall? 0 0 0 0   Risk for fall due to : Medication side effect No Fall Risks No Fall Risks No Fall Risks No Fall Risks  Follow up Falls prevention discussed;Falls evaluation completed Falls evaluation completed Falls prevention discussed;Education provided;Falls evaluation completed Falls evaluation completed Falls evaluation completed;Education provided;Falls prevention discussed    MEDICARE RISK AT HOME:  Medicare Risk at Home Any stairs in or around the home?: (Patient-Rptd) No Home free of loose throw rugs in walkways, pet beds, electrical cords, etc?: (Patient-Rptd) Yes Adequate lighting in your home to reduce risk of falls?: (Patient-Rptd) Yes Life alert?: (Patient-Rptd) No Use of a cane, walker or w/c?: (Patient-Rptd) No Grab bars in the bathroom?: (Patient-Rptd) Yes Shower chair or bench in shower?: (Patient-Rptd) No Elevated toilet seat or a handicapped toilet?: (Patient-Rptd) No  TIMED UP AND GO:  Was the test performed?  No  Cognitive Function: 6CIT completed        12/21/2023    8:52 AM 09/22/2022    8:52 AM  6CIT Screen  What Year? 0 points 0 points  What month? 0 points 0 points  What time? 0 points 0 points  Count back from 20 0 points 0 points  Months in reverse 0 points 0 points  Repeat phrase 0 points 0 points  Total Score 0 points 0 points  Immunizations Immunization History  Administered Date(s) Administered   Pneumococcal Conjugate-13 12/03/2016    Screening Tests Health Maintenance  Topic Date Due   Zoster Vaccines- Shingrix (1 of 2)  03/01/2024 (Originally 08/09/1997)   DTaP/Tdap/Td (1 - Tdap) 11/30/2024 (Originally 08/09/1966)   Pneumonia Vaccine 34+ Years old (2 of 2 - PPSV23) 11/30/2024 (Originally 01/28/2017)   Medicare Annual Wellness (AWV)  12/20/2024   Hepatitis C Screening  Completed   HPV VACCINES  Aged Out   Meningococcal B Vaccine  Aged Out   INFLUENZA VACCINE  Discontinued   Colonoscopy  Discontinued   COVID-19 Vaccine  Discontinued    Health Maintenance  There are no preventive care reminders to display for this patient.  Health Maintenance Items Addressed: Declines vaccines  Additional Screening:  Vision Screening: Recommended annual ophthalmology exams for early detection of glaucoma and other disorders of the eye.  Dental Screening: Recommended annual dental exams for proper oral hygiene  Community Resource Referral / Chronic Care Management: CRR required this visit?  No   CCM required this visit?  No   Plan:    I have personally reviewed and noted the following in the patient's chart:   Medical and social history Use of alcohol, tobacco or illicit drugs  Current medications and supplements including opioid prescriptions. Patient is not currently taking opioid prescriptions. Functional ability and status Nutritional status Physical activity Advanced directives List of other physicians Hospitalizations, surgeries, and ER visits in previous 12 months Vitals Screenings to include cognitive, depression, and falls Referrals and appointments  In addition, I have reviewed and discussed with patient certain preventive protocols, quality metrics, and best practice recommendations. A written personalized care plan for preventive services as well as general preventive health recommendations were provided to patient.   Areatha Beecham, LPN   1/61/0960   After Visit Summary: (MyChart) Due to this being a telephonic visit, the after visit summary with patients personalized plan was offered to  patient via MyChart   Notes: Nothing significant to report at this time.

## 2023-12-21 NOTE — Patient Instructions (Signed)
 Ms. Kelly Mccann , Thank you for taking time out of your busy schedule to complete your Annual Wellness Visit with me. I enjoyed our conversation and look forward to speaking with you again next year. I, as well as your care team,  appreciate your ongoing commitment to your health goals. Please review the following plan we discussed and let me know if I can assist you in the future. Your Game plan/ To Do List    Referrals: If you haven't heard from the office you've been referred to, please reach out to them at the phone provided.  N/a Follow up Visits: Next Medicare AWV with our clinical staff: 12/26/2024 at 8:50   Have you seen your provider in the last 6 months (3 months if uncontrolled diabetes)? Yes Next Office Visit with your provider: 01/25/2024 at 9:15  Clinician Recommendations:  Aim for 30 minutes of exercise or brisk walking, 6-8 glasses of water, and 5 servings of fruits and vegetables each day.       This is a list of the screening recommended for you and due dates:  Health Maintenance  Topic Date Due   Zoster (Shingles) Vaccine (1 of 2) 03/01/2024*   DTaP/Tdap/Td vaccine (1 - Tdap) 11/30/2024*   Pneumonia Vaccine (2 of 2 - PPSV23) 11/30/2024*   Medicare Annual Wellness Visit  12/20/2024   Hepatitis C Screening  Completed   HPV Vaccine  Aged Out   Meningitis B Vaccine  Aged Out   Flu Shot  Discontinued   Colon Cancer Screening  Discontinued   COVID-19 Vaccine  Discontinued  *Topic was postponed. The date shown is not the original due date.    Advanced directives: (In Chart) A copy of your advanced directives are scanned into your chart should your provider ever need it. Advance Care Planning is important because it:  [x]  Makes sure you receive the medical care that is consistent with your values, goals, and preferences  [x]  It provides guidance to your family and loved ones and reduces their decisional burden about whether or not they are making the right decisions based on  your wishes.  Follow the link provided in your after visit summary or read over the paperwork we have mailed to you to help you started getting your Advance Directives in place. If you need assistance in completing these, please reach out to us  so that we can help you!  See attachments for Preventive Care and Fall Prevention Tips.

## 2024-01-25 ENCOUNTER — Ambulatory Visit: Admitting: Medical

## 2024-01-25 ENCOUNTER — Encounter: Payer: Self-pay | Admitting: Medical

## 2024-01-25 VITALS — BP 128/78 | HR 71 | Ht 65.75 in | Wt 203.6 lb

## 2024-01-25 DIAGNOSIS — I10 Essential (primary) hypertension: Secondary | ICD-10-CM | POA: Diagnosis not present

## 2024-01-25 DIAGNOSIS — I700 Atherosclerosis of aorta: Secondary | ICD-10-CM

## 2024-01-25 DIAGNOSIS — E785 Hyperlipidemia, unspecified: Secondary | ICD-10-CM

## 2024-01-25 MED ORDER — EDARBYCLOR 40-25 MG PO TABS: 1.0000 | ORAL_TABLET | Freq: Every day | ORAL | 3 refills | Status: DC

## 2024-01-25 NOTE — Progress Notes (Signed)
 Subjective:  Kelly Mccann is a 78 y.o. female who presents for Chief Complaint  Patient presents with   Follow-up    6-8 week follow up, no new concerns. Patient is fasting.      Here for medication follow-up.  I saw her in April 2025 for physical.  She was on valsartan  HCT prior but was still not at goal.  We changed to Edarbichlor 40/25 mg daily.  This is working well for her without any side effects.  Regarding lipids, she has been reluctant to start prescription medication.  She has been doing a combination of garlic and lemon juice.  However her diet has not been the best.  She wants to work on diet and exercise changes and then recheck fasting labs before deciding on medication.  She will come back in a month to check this.  Otherwise in normal state of health  No other aggravating or relieving factors.    No other c/o.   Past Medical History:  Diagnosis Date   Arthritis    Asthma    Atherosclerosis    Hx of adenomatous colonic polyps 04/09/2016   Hypercholesterolemia    Hypertension    Osteopenia determined by x-ray    Prediabetes    Current Outpatient Medications on File Prior to Visit  Medication Sig Dispense Refill   Azilsartan-Chlorthalidone  (EDARBYCLOR ) 40-25 MG TABS Take 1 tablet by mouth daily.     No current facility-administered medications on file prior to visit.     The following portions of the patient's history were reviewed and updated as appropriate: allergies, current medications, past family history, past medical history, past social history, past surgical history and problem list.  ROS Otherwise as in subjective above    Objective: BP 128/78   Pulse 71   Ht 5' 5.75 (1.67 m)   Wt 203 lb 9.6 oz (92.4 kg)   SpO2 99%   BMI 33.11 kg/m   BP Readings from Last 3 Encounters:  01/25/24 128/78  12/01/23 (!) 140/100  09/01/23 132/82   Wt Readings from Last 3 Encounters:  01/25/24 203 lb 9.6 oz (92.4 kg)  12/01/23 201 lb 9.6 oz (91.4 kg)   09/01/23 196 lb 9.6 oz (89.2 kg)    General appearance: alert, no distress, well developed, well nourished Pulses: 2+ radial pulses, 2+ pedal pulses, normal cap refill Ext: no edema   Assessment: Encounter Diagnoses  Name Primary?   Primary hypertension Yes   Dyslipidemia    Atherosclerosis of aorta (HCC)      Plan: Hypertension-prior not at goal on high-dose valsartan  HCT.  She is doing well on current medication.  Continue Edarbi chlor 40/25 mg daily  Dyslipidemia-counseled on labs and prior CT abdomen pelvis showing atherosclerosis of coronary arteries and aorta.  Discussed the importance of medication diet to control lipids.  She will work harder on her diet based on recommendations discussed today.  Continue regular exercise.  She declines prescription medicines and she particularly wants to avoid statins given potential risks.  She will return in 1 month to recheck fasting labs    Jaquasia was seen today for follow-up.  Diagnoses and all orders for this visit:  Primary hypertension  Dyslipidemia -     Cancel: Lipid panel -     Lipid panel; Future  Atherosclerosis of aorta (HCC)  Other orders -     Azilsartan-Chlorthalidone  (EDARBYCLOR ) 40-25 MG TABS; Take 1 tablet by mouth daily.    Follow up: return in a month  for fasting lab

## 2024-01-28 ENCOUNTER — Other Ambulatory Visit: Payer: Self-pay | Admitting: Medical

## 2024-01-28 MED ORDER — OLMESARTAN-AMLODIPINE-HCTZ 40-5-12.5 MG PO TABS: 1.0000 | ORAL_TABLET | Freq: Every day | ORAL | 1 refills | Status: DC

## 2024-03-06 ENCOUNTER — Ambulatory Visit (INDEPENDENT_AMBULATORY_CARE_PROVIDER_SITE_OTHER): Admitting: Medical

## 2024-03-06 VITALS — BP 160/94 | HR 92 | Wt 203.2 lb

## 2024-03-06 DIAGNOSIS — E7800 Pure hypercholesterolemia, unspecified: Secondary | ICD-10-CM

## 2024-03-06 DIAGNOSIS — N189 Chronic kidney disease, unspecified: Secondary | ICD-10-CM

## 2024-03-06 DIAGNOSIS — I10 Essential (primary) hypertension: Secondary | ICD-10-CM | POA: Diagnosis not present

## 2024-03-06 MED ORDER — ALBUTEROL SULFATE HFA 108 (90 BASE) MCG/ACT IN AERS: 2.0000 | INHALATION_SPRAY | Freq: Four times a day (QID) | RESPIRATORY_TRACT | 1 refills | Status: AC | PRN

## 2024-03-06 MED ORDER — ATENOLOL 25 MG PO TABS: 25.0000 mg | ORAL_TABLET | Freq: Every day | ORAL | 2 refills | Status: AC

## 2024-03-06 MED ORDER — VALSARTAN-HYDROCHLOROTHIAZIDE 160-12.5 MG PO TABS: 1.0000 | ORAL_TABLET | Freq: Every day | ORAL | 1 refills | Status: AC

## 2024-03-06 NOTE — Progress Notes (Signed)
 Subjective:  Kelly Mccann is a 78 y.o. female who presents for Chief Complaint  Patient presents with   Follow-up    Follow-up on BP, olmersartan was too strong for her and caused her SOB and made her feel bad, ebary cost to much     Here for medication follow-up.  I saw her in April 2025 for physical.  She was on valsartan  HCT prior but was still not at goal.  We changed to Edarbichlor 40/25 mg daily after her 11/2023 well visit.  This was working well for her without any side effects, but was too expensive.  We then changed to tribenzor. . She started tribenzor after last visit next and felt weighted down on that medication, felt SOB.   She quit taking the tribenzor after 3 days.   She has been using cloves and herbrs to treat her BP.   She feels like her home BP cuff is not accurate.  Regarding lipids, she had been reluctant to start prescription medication.  She has been doing a combination of garlic and lemon juice.  Since 11/2023 she wanted to work on diet and exercise changes and then recheck fasting labs before deciding on medication.  Here for fasting labs today  Otherwise in normal state of health  No other aggravating or relieving factors.    No other c/o.   Past Medical History:  Diagnosis Date   Arthritis    Asthma    Atherosclerosis    Hx of adenomatous colonic polyps 04/09/2016   Hypercholesterolemia    Hypertension    Osteopenia determined by x-ray    Prediabetes    No current outpatient medications on file prior to visit.   No current facility-administered medications on file prior to visit.     The following portions of the patient's history were reviewed and updated as appropriate: allergies, current medications, past family history, past medical history, past social history, past surgical history and problem list.  ROS Otherwise as in subjective above    Objective: BP (!) 160/94 Comment: stopped medication for Bp, patient knew it was going to be up   Pulse 92   Wt 203 lb 3.2 oz (92.2 kg)   BMI 33.05 kg/m   BP Readings from Last 3 Encounters:  03/06/24 (!) 160/94  01/25/24 128/78  12/01/23 (!) 140/100   Wt Readings from Last 3 Encounters:  03/06/24 203 lb 3.2 oz (92.2 kg)  01/25/24 203 lb 9.6 oz (92.4 kg)  12/01/23 201 lb 9.6 oz (91.4 kg)   General appearance: alert, no distress, well developed, well nourished Pulses: 2+ radial pulses, 2+ pedal pulses, normal cap refill Ext: no edema Heart rrr, normal s1, s2, no murmurs Lungs clear    Assessment: Encounter Diagnoses  Name Primary?   Primary hypertension Yes   Hypercholesterolemia    Chronic kidney disease, unspecified CKD stage       Plan: Hypertension-we will go back to valsartan  HCT which she was doing well on before April 2025, however only April 2025 visit her blood pressure not to goal.  She was doing okay on Edarbichlor but this was too expensive.  She did not tolerate trial of Tribenzor most recently.  Prior medication past have included atenolol , hydralazine , hydrochlorothiazide , valsartan  HCT, edarbi, and now tribenzor.  Her new regimen will be valsartan  HCT plus atenolol  which she says she has tolerated both in the past.  Continue blood pressure monitoring.  Dyslipidemia-she has been using lifestyle changes instead of medication since April  2025 for cholesterol.  We had previously discussed her lipid panel, risk of heart disease and stroke.  She was reluctant to start the case again given her prior problems with cholesterol medicine.  Updated labs today to see how her her lifestyle changes have been working  Follow-up in 1 to 2 months.  Bring blood pressure cuff next visit  Phillippa was seen today for follow-up.  Diagnoses and all orders for this visit:  Primary hypertension  Hypercholesterolemia -     Lipid panel  Chronic kidney disease, unspecified CKD stage  Other orders -     valsartan -hydrochlorothiazide  (DIOVAN  HCT) 160-12.5 MG tablet; Take 1  tablet by mouth daily. -     atenolol  (TENORMIN ) 25 MG tablet; Take 1 tablet (25 mg total) by mouth daily. -     albuterol  (VENTOLIN  HFA) 108 (90 Base) MCG/ACT inhaler; Inhale 2 puffs into the lungs every 6 (six) hours as needed for wheezing or shortness of breath.    Follow up: return in 1- 70mo for recheck.

## 2024-03-07 ENCOUNTER — Ambulatory Visit: Payer: Self-pay | Admitting: Medical

## 2024-03-07 ENCOUNTER — Other Ambulatory Visit: Payer: Self-pay | Admitting: Medical

## 2024-03-07 LAB — LIPID PANEL
Chol/HDL Ratio: 3.4 ratio (ref 0.0–4.4)
Cholesterol, Total: 241 mg/dL — ABNORMAL HIGH (ref 100–199)
HDL: 70 mg/dL (ref >39)
LDL Chol Calc (NIH): 146 mg/dL — ABNORMAL HIGH (ref 0–99)
Triglycerides: 141 mg/dL (ref 0–149)
VLDL Cholesterol Cal: 25 mg/dL (ref 5–40)

## 2024-03-07 MED ORDER — FENOFIBRATE 145 MG PO TABS: 145.0000 mg | ORAL_TABLET | Freq: Every day | ORAL | 0 refills | Status: DC

## 2024-03-07 NOTE — Progress Notes (Signed)
Results through My Chart

## 2024-04-24 ENCOUNTER — Ambulatory Visit: Admitting: Medical

## 2024-05-03 NOTE — Progress Notes (Signed)
   05/03/2024  Patient ID: Kelly Mccann, female   DOB: 1946/06/30, 78 y.o.   MRN: [id number]  Pharmacy Quality Measure Review  This patient is appearing on a report for being at risk of failing the adherence measure for hypertension (ACEi/ARB) medications this calendar year.   Medication: Olmesartan /Amlodipine /HCTZ Last fill date: 01/28/24 for 90 day supply  Med changed to valsartan /hydrochlorothiazide , filled 03/06/24 90ds  Jon VEAR Lindau, PharmD Clinical Pharmacist [phone]

## 2024-05-30 ENCOUNTER — Other Ambulatory Visit: Payer: Self-pay | Admitting: Medical

## 2024-06-13 ENCOUNTER — Telehealth: Payer: Self-pay

## 2024-06-13 NOTE — Progress Notes (Signed)
   06/13/2024  Patient ID: Kelly Mccann [place], female   DOB: 1945-10-18, 78 y.o.   MRN: [id number]  Pharmacy Quality Measure Review  This patient is appearing on a report for being at risk of failing the adherence measure for hypertension (ACEi/ARB) medications this calendar year.   Medication: Valsartan -HCTZ Last fill date: 03/06/24 for 90 day supply  Spoke with patient. Reports she self-stopped medication because she felt it was not lowering her BP and she felt like it was giving her heart palpitations. Reports dietary lifestyle changes and that her BP has been in the 130s/80 when checking at home. Will notify PCP  Jon VEAR Lindau, PharmD Clinical Pharmacist [phone]

## 2024-06-14 NOTE — Telephone Encounter (Signed)
 Pt is coming in next week and can discuss cardiology referral at that time. Pt has not been checking her bps regularly

## 2024-06-14 NOTE — Telephone Encounter (Signed)
 Pt is scheduled next friday

## 2024-06-23 ENCOUNTER — Ambulatory Visit: Admitting: Medical

## 2024-12-07 ENCOUNTER — Ambulatory Visit: Payer: Self-pay | Admitting: Medical

## 2024-12-26 ENCOUNTER — Ambulatory Visit: Payer: Self-pay
